# Patient Record
Sex: Female | Born: 1990 | Race: White | Hispanic: No | Marital: Married | Smoking: Former smoker
Health system: Southern US, Community
[De-identification: ages and names within clinical notes are randomized; demographics above are authoritative.]

## PROBLEM LIST (undated history)

## (undated) ENCOUNTER — Inpatient Hospital Stay (HOSPITAL_COMMUNITY): Payer: Self-pay

## (undated) DIAGNOSIS — J45909 Unspecified asthma, uncomplicated: Secondary | ICD-10-CM

## (undated) DIAGNOSIS — Z8632 Personal history of gestational diabetes: Secondary | ICD-10-CM

## (undated) DIAGNOSIS — B009 Herpesviral infection, unspecified: Secondary | ICD-10-CM

## (undated) DIAGNOSIS — O09299 Supervision of pregnancy with other poor reproductive or obstetric history, unspecified trimester: Secondary | ICD-10-CM

## (undated) DIAGNOSIS — O24419 Gestational diabetes mellitus in pregnancy, unspecified control: Secondary | ICD-10-CM

## (undated) HISTORY — DX: Personal history of gestational diabetes: Z86.32

## (undated) HISTORY — DX: Gestational diabetes mellitus in pregnancy, unspecified control: O24.419

## (undated) HISTORY — DX: Supervision of pregnancy with other poor reproductive or obstetric history, unspecified trimester: O09.299

---
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
## 2013-12-02 ENCOUNTER — Encounter (HOSPITAL_COMMUNITY): Payer: Self-pay | Admitting: Emergency Medicine

## 2013-12-02 ENCOUNTER — Emergency Department (HOSPITAL_COMMUNITY)
Admission: EM | Admit: 2013-12-02 | Discharge: 2013-12-02 | Disposition: A | Discharge status: Home / Self Care | Payer: Medicaid Other | Attending: Emergency Medicine | Admitting: Emergency Medicine

## 2013-12-02 DIAGNOSIS — K149 Disease of tongue, unspecified: Secondary | ICD-10-CM

## 2013-12-02 DIAGNOSIS — K1370 Unspecified lesions of oral mucosa: Secondary | ICD-10-CM | POA: Insufficient documentation

## 2013-12-02 DIAGNOSIS — J029 Acute pharyngitis, unspecified: Secondary | ICD-10-CM

## 2013-12-02 DIAGNOSIS — J45909 Unspecified asthma, uncomplicated: Secondary | ICD-10-CM | POA: Insufficient documentation

## 2013-12-02 LAB — RAPID STREP SCREEN (MED CTR MEBANE ONLY): Streptococcus, Group A Screen (Direct): NEGATIVE

## 2013-12-02 MED ORDER — IBUPROFEN 800 MG PO TABS: 800.0000 mg | ORAL_TABLET | Freq: Three times a day (TID) | ORAL | Status: DC

## 2013-12-02 NOTE — Discharge Instructions (Signed)
Call for a follow up appointment with a Family or Primary Care Provider.  Call Dr. Annalee GentaShoemaker for further evaluation of your sore tongue and sore throat.  Return if Symptoms worsen.   Take medication as prescribed.   Emergency Department Resource Guide 1) Find a Doctor and Pay Out of Pocket Although you won't have to find out who is covered by your insurance plan, it is a good idea to ask around and get recommendations. You will then need to call the office and see if the doctor you have chosen will accept you as a new patient and what types of options they offer for patients who are self-pay. Some doctors offer discounts or will set up payment plans for their patients who do not have insurance, but you will need to ask so you aren't surprised when you get to your appointment.  2) Contact Your Local Health Department Not all health departments have doctors that can see patients for sick visits, but many do, so it is worth a call to see if yours does. If you don't know where your local health department is, you can check in your phone book. The CDC also has a tool to help you locate your state's health department, and many state websites also have listings of all of their local health departments.  3) Find a Walk-in Clinic If your illness is not likely to be very severe or complicated, you may want to try a walk in clinic. These are popping up all over the country in pharmacies, drugstores, and shopping centers. They're usually staffed by nurse practitioners or physician assistants that have been trained to treat common illnesses and complaints. They're usually fairly quick and inexpensive. However, if you have serious medical issues or chronic medical problems, these are probably not your best option.  No Primary Care Doctor: - Call Health Connect at  [phone] - they can help you locate a primary care doctor that  accepts your insurance, provides certain services, etc. - Physician Referral Service-  [phone]  Chronic Pain Problems: Organization         Address  Phone   Notes  [facility]  [phone] Patients need to be referred by their primary care doctor.   Medication Assistance: Organization         Address  Phone   Notes  [facility] [address] [place], [place] [address] [phone] --Must be a resident of [facility] -- Must have NO insurance coverage whatsoever (no Medicaid/ Medicare, etc.) -- The pt. MUST have a primary care doctor that directs their care regularly and follows them in the community   MedAssist  [phone]   [employer]  [phone]    Agencies that provide inexpensive medical care: Organization         Address  Phone   Notes  [facility]  [phone]   [facility]    [phone]   [facility] [address] [place], [place] [address] [phone]   [facility] [address], [place] [phone]   Planned Parenthood    [phone]   [facility]    [phone]   [facility]  [address], [place] Phone:  [phone], Fax:  [phone] Hours of Operation:  9 am - 6 pm, M-F.  Also accepts Medicaid/Medicare and self-pay.  [facility] for [place], [address], [place] Phone: [phone], Fax: [phone]. Hours of Operation:  8:30 am - 5:30 pm, M-F.  Also accepts Medicaid and self-pay.  [facility] [place] [address] [place] Phone: [phone]   [place], [place], [place] [phone], Ext. [phone] Mondays & Thursdays: 7-9 AM.  First 15 patients are seen on a first come, first serve basis.    [place] Providers:  Organization         Address  Phone   Notes  [facility] [address],  [place] [phone] Also accepts self-pay patients.  [facility] [id number] [place], [place]  [phone]   [place], [address], [place] [phone]   [facility] [address] [place] [phone]   Lucianne Lei [address], [place]   [phone] Only accepts [place] Access [place] patients after they have their name applied to their card.   Self-Pay (no insurance) in [facility]:  Organization         Address  Phone   Notes  Sickle Cell Patients, [facility] [place] [phone]   [facility] [place] [phone]   [facility] [place]  [place], [place], [place] [phone]   [facility]/Dr. Osei-Bonsu  [address] [place] [address] [place] or [address], [place] [phone] Phone number for both [place] and [place] locations is the same.  Urgent Medical and [facility] [address], [place] [phone]   [facility] [address] [place] or [address] [phone]   [facility] [address], [place] [phone], phone; [phone], fax Sees patients 1st and 3rd Saturday of every month.  Must not qualify for public or private insurance (i.e. Medicaid, Medicare, [place] Health Choice, Veterans' Benefits)  Household income should be no more than 200% of the poverty level The clinic cannot treat you if you are pregnant or think you are pregnant  Sexually transmitted diseases are not treated at the clinic.    Dental Care: Organization         Address  Phone  Notes  [facility] of [facility] [place] [phone] Accepts children up to age 81 who are enrolled in  [place] or [place]; pregnant women with a Medicaid card; and children who have applied for Medicaid or  Health Choice, but were declined, whose parents can pay a reduced fee at time of service.  [facility] of [facility]  [address], [place] [phone] Accepts children up to age 44 who are enrolled in [place] or [place]; pregnant women with a Medicaid card; and children who have applied for Medicaid or  Health Choice, but were declined, whose parents can pay a reduced fee at time of service.  [facility]  [place] [phone] Patients are seen by appointment only. Walk-ins are not accepted. [place] will see patients 52 years of age and older. Monday - Tuesday (8am-5pm) Most Wednesdays (8:30-5pm) $30 per  visit, cash only  [facility] Adult [employer] PROGRAM  [address], [address] [phone] Patients are seen by appointment only. Walk-ins are not accepted. [place] will see patients 48 years of age and older. One Wednesday Evening (Monthly: Volunteer Based).  $30 per visit, cash only  [place]  [phone] for adults; Children under age 62, call Graduate Pediatric Dentistry at [phone]. Children aged 41-14, please call [phone] to request a pediatric application.  Dental services are provided in all areas of dental care including fillings, crowns and bridges, complete and partial dentures, implants, gum treatment, root canals, and extractions. Preventive care is also provided. Treatment is provided to both adults and children. Patients are selected via a lottery and there is often a waiting list.   [facility] [address] [place]  [phone] [email or web address]   [facility] [address] [place], [place] [phone], Ext. [phone] Second and Fourth Thursday of each month, opens at 6:30  AM; Clinic ends at 9 AM.  Patients are seen on a first-come first-served basis, and a limited number are seen during each clinic.   [facility]  [address] Hillard Danker [place], [place] [phone]   Eligibility Requirements You must have lived in [place], [place], or [place] counties for at least the last three months.   You cannot be eligible for state or federal sponsored [employer], including [employer], [place], or [employer].   You generally cannot be eligible for healthcare insurance through your employer.    How to apply: Eligibility screenings are held every Tuesday and Wednesday afternoon from 1:00 pm until 4:00 pm. You do not need an appointment for the interview!  [facility] [address], [place], [place]   [place]  [place] Department  [place]  [phone]    Behavioral Health Resources in the Community: Intensive Outpatient Programs Organization         Address  Phone  Notes  Ecru Dyer. [address] [place], [place] [phone]   [facility] Outpatient [address] [place], [place]   ADS: Alcohol & Drug Svcs [address] [place], [place]   [place] [address]  [place], [place] or [phone]   Substance Abuse Resources Organization         Address  Phone  Notes  Alcohol and Drug Services  [phone]   [place]  [phone]   The [place]   Chinita Pester  [phone]   Residential & Outpatient Substance Abuse Program  [phone]   Psychological Services Organization         Address  Phone  Notes  [facility] [place]  [place]  [phone]   [place] [address] [place] or  [phone]    Mobile Crisis Teams Organization         Address  Phone  Notes  [facility], Mobile Crisis Care Unit  [phone]   [facility]  [address] [place], [place]   [place] [address], [place] [phone]    Self-Help/Support Groups Organization         Address  Phone  Notes  Mental Health Assoc. of [place] - variety of support groups  [place] Call for more information  Narcotics Anonymous (NA), [facility] [address], [employer] [place]  2 meetings at this location   Special educational needs teacher         Address  Phone  Notes  [facility] Residential Treatment [place],    [place]  [phone]   [facility]  [address] [place] 426834, [place], [place]   [place], [place] [phone] Admissions: 8am-3pm M-F  Incentives Substance [place] [address],    [place], [place] [phone]   [facility] [address] [place], [place], [place]   [facility] [address]  [place], [place]   [facility] - Intensive Outpatient [address] Kristeen Mans [address], [place], [place]   [facility] [place].,  [place], [place] [phone] or [phone]   [facility] Services (RTS) [address], [place], [place] Accepts Medicaid  [facility] [place] [address]  [place] [phone] Substance Abuse/Addiction Treatment   [facility] Organization         Address  Phone  Notes  [facility]  [phone]   Domenic Schwab, PhD [address] Arlis Porta [place], [place]   [phone] or [phone]   [place], [place] [phone]   [facility] [address],  [place], [place] [phone] Insurance/Medicaid/sponsorship through [facility] and Families [address], [address]                                    [place], [place] [phone] [place] [address] [place], [place] [phone]    Dr. Adele Schilder  [phone]   [facility] Dept. 1) [address] [place] 2) [place] 3)  [place] 65, [place] [phone]  [phone]   [place] [phone] or [phone] (After Hours)

## 2013-12-02 NOTE — ED Notes (Signed)
Pt presents to department for evaluation of sore throat. Ongoing since Monday. 6/10 pain at the time. Respirations unlabored. Pt is alert and oriented x4.

## 2013-12-02 NOTE — ED Provider Notes (Signed)
CSN: [id number]     Arrival date & time 12/02/13  1229 History  This chart was scribed for non-physician practitioner Mellody DrownLauren Swan Zayed working with Juliet RudeNathan R. Rubin PayorPickering, MD by Carl Bestelina Holson, ED Scribe. This patient was seen in room TR08C/TR08C and the patient's care was started at 12:37 PM.    Chief Complaint  Patient presents with  . Sore Throat   HPI Comments: Lacey Hill is a 23 y.o. female with a history of asthma who presents to the Emergency Department complaining of constant sore throat that started two days ago.  She states that she noticed sores on her tongue before her sore throat started.  The patient states that she has not had mouth sores in the past.  She denies fever, chills, wheezing, and cough as associated symptoms.  She denies eating anything abnormal recently.  She denies any sick contacts.  The patient has a history of tonsillectomy.     Patient is a 23 y.o. female presenting with pharyngitis. The history is provided by the patient. No language interpreter was used.  Sore Throat    No past medical history on file. No past surgical history on file. No family history on file. History  Substance Use Topics  . Smoking status: Not on file  . Smokeless tobacco: Not on file  . Alcohol Use: Not on file   OB History   No data available     Review of Systems  Constitutional: Negative for fever and chills.  HENT: Positive for mouth sores and sore throat. Negative for dental problem, drooling and trouble swallowing.   Respiratory: Negative for cough and wheezing.   All other systems reviewed and are negative.     Allergies  Review of patient's allergies indicates not on file.  Home Medications   Prior to Admission medications   Not on File   Triage Vitals: BP 115/80  Pulse 114  Temp(Src) 98.2 F (36.8 C) (Oral)  Resp 18  SpO2 98%  Physical Exam  Nursing note and vitals reviewed. Constitutional: She is oriented to person, place, and time. She appears  well-developed and well-nourished.  HENT:  Head: Normocephalic and atraumatic.  Mouth/Throat: Uvula is midline and mucous membranes are normal. No trismus in the jaw. No uvula swelling. Posterior oropharyngeal erythema present. No oropharyngeal exudate, posterior oropharyngeal edema or tonsillar abscesses.  Tonsils surgically absent.  Geographic tongue to lateral boarders, tender to palpation.  Eyes: EOM are normal.  Neck: Normal range of motion.  Cardiovascular: Normal rate, regular rhythm and normal heart sounds.  Exam reveals no gallop and no friction rub.   No murmur heard. Pulmonary/Chest: Effort normal.  Musculoskeletal: Normal range of motion.  Lymphadenopathy:       Head (right side): No submental, no submandibular, no tonsillar, no preauricular, no posterior auricular and no occipital adenopathy present.       Head (left side): No submental, no submandibular, no tonsillar, no preauricular, no posterior auricular and no occipital adenopathy present.    She has no cervical adenopathy.    She has no axillary adenopathy.  Neurological: She is alert and oriented to person, place, and time.  Skin: Skin is warm and dry. She is not diaphoretic.  Psychiatric: She has a normal mood and affect. Her behavior is normal.    ED Course  Procedures (including critical care time)  COORDINATION OF CARE: 1:03 PM- Discussed obtaining a rapid strep screen and the patient agreed to the treatment plan.   Labs Review Labs Reviewed  RAPID  STREP SCREEN  CULTURE, GROUP A STREP    Imaging Review No results found.   EKG Interpretation None      MDM   Final diagnoses:  Pharyngitis  Tongue irritation   Rapid strep negative. Patients symptoms are consistent with URI, likely viral etiology.  And questionable viral tongue discomfort. Discussed that antibiotics are not indicated for viral infections. Pt will be discharged with symptomatic treatment.  Verbalizes understanding and is agreeable  with plan. Pt is hemodynamically stable & in NAD prior to dc.  Meds given in ED:  Medications - No data to display  Discharge Medication List as of 12/02/2013  1:48 PM    START taking these medications   Details  ibuprofen (ADVIL,MOTRIN) 800 MG tablet Take 1 tablet (800 mg total) by mouth 3 (three) times daily. Take with food, Starting 12/02/2013, Until Discontinued, Print       I personally performed the services described in this documentation, which was scribed in my presence. The recorded information has been reviewed and is accurate.    Clabe SealLauren M Giah Fickett, PA-C 12/03/13 2249

## 2013-12-02 NOTE — ED Notes (Signed)
C/O her tongue hurting. States that she cannot eat or drink anything. Also C/O having a sore throat but her tongue hurts worse.  States that it began on Monday. Pain is on the sides of her tongue mostly.  RN note some lesions on the side of her tongue.

## 2013-12-04 LAB — CULTURE, GROUP A STREP

## 2013-12-04 NOTE — ED Provider Notes (Signed)
Medical screening examination/treatment/procedure(s) were performed by non-physician practitioner and as supervising physician I was immediately available for consultation/collaboration.   EKG Interpretation None       Telesforo Brosnahan R. Weslee Prestage, MD 12/04/13 0716 

## 2014-10-30 ENCOUNTER — Emergency Department (HOSPITAL_COMMUNITY)
Admission: EM | Admit: 2014-10-30 | Discharge: 2014-10-30 | Disposition: A | Discharge status: Home / Self Care | Payer: Medicaid Other | Attending: Emergency Medicine | Admitting: Emergency Medicine

## 2014-10-30 ENCOUNTER — Emergency Department (HOSPITAL_COMMUNITY): Payer: Medicaid Other

## 2014-10-30 ENCOUNTER — Encounter (HOSPITAL_COMMUNITY): Payer: Self-pay

## 2014-10-30 DIAGNOSIS — J45909 Unspecified asthma, uncomplicated: Secondary | ICD-10-CM | POA: Diagnosis not present

## 2014-10-30 DIAGNOSIS — R079 Chest pain, unspecified: Secondary | ICD-10-CM

## 2014-10-30 DIAGNOSIS — Z7952 Long term (current) use of systemic steroids: Secondary | ICD-10-CM | POA: Insufficient documentation

## 2014-10-30 DIAGNOSIS — Z880 Allergy status to penicillin: Secondary | ICD-10-CM | POA: Insufficient documentation

## 2014-10-30 DIAGNOSIS — Z79899 Other long term (current) drug therapy: Secondary | ICD-10-CM | POA: Diagnosis not present

## 2014-10-30 DIAGNOSIS — R091 Pleurisy: Secondary | ICD-10-CM | POA: Diagnosis not present

## 2014-10-30 DIAGNOSIS — R0789 Other chest pain: Secondary | ICD-10-CM | POA: Insufficient documentation

## 2014-10-30 DIAGNOSIS — R0781 Pleurodynia: Secondary | ICD-10-CM

## 2014-10-30 HISTORY — DX: Unspecified asthma, uncomplicated: J45.909

## 2014-10-30 MED ORDER — NAPROXEN 375 MG PO TABS: ORAL_TABLET | ORAL | Status: DC

## 2014-10-30 MED ORDER — KETOROLAC TROMETHAMINE 60 MG/2ML IM SOLN
60.0000 mg | Freq: Once | INTRAMUSCULAR | Status: AC
Start: 2014-10-30 — End: 2014-10-30
  Administered 2014-10-30: 60 mg via INTRAMUSCULAR
  Filled 2014-10-30: qty 2

## 2014-10-30 NOTE — ED Notes (Signed)
Patient states pain to left upper chest that is worse with deep breathing.

## 2014-10-30 NOTE — ED Notes (Signed)
Patient verbalizes understanding of discharge instructions, prescription medications, home care and follow up care. Patient ambulatory out of department at this time. 

## 2014-10-30 NOTE — ED Provider Notes (Signed)
CSN: [id number]     Arrival date & time 10/30/14  0114 History   First MD Initiated Contact with Patient 10/30/14 0130     Chief Complaint  Patient presents with  . Pleurisy     (Consider location/radiation/quality/duration/timing/severity/associated sxs/prior Treatment) HPI  Patient reports she started getting left lateral chest pain on March 24. She describes the pain as pleuritic and sharp and achy. It has been constant although it is worse when she breathes deep. She also states lifting up her arms and bending over makes the pain worse. She denies wheezing or shortness of breath however she thought maybe it was related to her asthma and did a nebulizer treatment without improvement. She denies cough or fever. She denies any injury. She denies sore throat, rhinorrhea, swelling or pain in her extremities. She denies being on hormones or birth control pills. She states she had similar pain when she was 24 years old and she was diagnosed with pleurisy.   PCP none  Past Medical History  Diagnosis Date  . Asthma    History reviewed. No pertinent past surgical history. No family history on file. History  Substance Use Topics  . Smoking status: Never Smoker   . Smokeless tobacco: Not on file  . Alcohol Use: No   Employed at [employer]   OB History    No data available     Review of Systems  All other systems reviewed and are negative.     Allergies  Penicillins  Home Medications   Prior to Admission medications   Medication Sig Start Date End Date Taking? Authorizing Provider  acyclovir (ZOVIRAX) 200 MG capsule Take 200 mg by mouth 2 (two) times daily.   Yes Historical Provider, MD  albuterol (PROVENTIL HFA;VENTOLIN HFA) 108 (90 BASE) MCG/ACT inhaler Inhale 1 puff into the lungs every 6 (six) hours as needed for wheezing or shortness of breath.   Yes Historical Provider, MD  FLUoxetine (PROZAC) 20 MG tablet Take 20 mg by mouth daily.    Historical Provider, MD    ibuprofen (ADVIL,MOTRIN) 800 MG tablet Take 1 tablet (800 mg total) by mouth 3 (three) times daily. Take with food 12/02/13   Mellody Drown, PA-C  predniSONE (DELTASONE) 20 MG tablet Take 20 mg by mouth daily as needed (for asthma).    Historical Provider, MD   BP 115/77 mmHg  Pulse 70  Temp(Src) 97.5 F (36.4 C) (Oral)  Resp 24  Ht 5' (1.524 m)  Wt 95 lb (43.092 kg)  BMI 18.55 kg/m2  SpO2 100%  LMP 10/05/2014  Vital signs normal   Physical Exam  Constitutional: She is oriented to person, place, and time. She appears well-developed and well-nourished.  Non-toxic appearance. She does not appear ill. No distress.  HENT:  Head: Normocephalic and atraumatic.  Right Ear: External ear normal.  Left Ear: External ear normal.  Nose: Nose normal. No mucosal edema or rhinorrhea.  Mouth/Throat: Oropharynx is clear and moist and mucous membranes are normal. No dental abscesses or uvula swelling.  Eyes: Conjunctivae and EOM are normal. Pupils are equal, round, and reactive to light.  Neck: Normal range of motion and full passive range of motion without pain. Neck supple.  Cardiovascular: Normal rate, regular rhythm and normal heart sounds.  Exam reveals no gallop and no friction rub.   No murmur heard. Pulmonary/Chest: Effort normal and breath sounds normal. No respiratory distress. She has no wheezes. She has no rhonchi. She has no rales. She exhibits tenderness. She  exhibits no crepitus.    Abdominal: Soft. Normal appearance and bowel sounds are normal. She exhibits no distension. There is no tenderness. There is no rebound and no guarding.  Musculoskeletal: Normal range of motion. She exhibits no edema or tenderness.  Moves all extremities well.   Neurological: She is alert and oriented to person, place, and time. She has normal strength. No cranial nerve deficit.  Skin: Skin is warm, dry and intact. No rash noted. No erythema. No pallor.  Psychiatric: She has a normal mood and affect. Her  speech is normal and behavior is normal. Her mood appears not anxious.  Nursing note and vitals reviewed.   ED Course  Procedures (including critical care time)  Medications  ketorolac (TORADOL) injection 60 mg (60 mg Intramuscular Given 10/30/14 0201)   Pt drove herself to the ED. patient was given Toradol IM for her pleuritic chest pain.  Recheck at 3:20 AM patient states her pain is almost gone. She is feeling much improved. We discussed reasons to return to the ED. She was given the results of her chest x-ray.   Labs Review Labs Reviewed - No data to display  Imaging Review Dg Chest 2 View  10/30/2014   CLINICAL DATA:  Left-sided chest pain, worse with deep inspiration. Symptoms for 2 days. History of asthma.  EXAM: CHEST  2 VIEW  COMPARISON:  None.  FINDINGS: The heart size and mediastinal contours are within normal limits. Both lungs are clear. The visualized skeletal structures are unremarkable.  IMPRESSION: No active cardiopulmonary disease.   Electronically Signed   By: Charlett NoseKevin  Dover M.D.   On: 10/30/2014 02:23     EKG Interpretation None      MDM   Final diagnoses:  Pleuritic chest pain  Left sided chest pain   New Prescriptions   NAPROXEN (NAPROSYN) 375 MG TABLET    Take 1 po BID with food prn pain    Plan discharge  Devoria AlbeIva Adenike Shidler, MD, Concha PyoFACEP     Jalien Weakland, MD 10/30/14 [phone]

## 2014-10-30 NOTE — ED Notes (Signed)
Pt reports pain with breathing, especially in left rib area, states she used her nebs and inhaler without relief.

## 2014-10-30 NOTE — Discharge Instructions (Signed)
Take the naproxen for your pleuritic chest pain. Recheck if you get a fever, struggle to breathe, have a worsening cough.  Pleurodynia Pleurodynia is a sharp pain in the muscles between your ribs (intercostal muscles). This condition makes it painful to breathe. Pleurodynia is sometimes described as an iron grip around the rib cage. Pleurodynia attacks are unpredictable. CAUSES  Pleurodynia is commonly caused by a viral infection. A virus, called coxsackievirus B, attacks the intercostal muscles. However, getting pleurodynia from this virus is rare. Most people infected with coxsackievirus B have no symptoms. In some people, the virus causes a mild sore throat, cough, or diarrhea. Coxsackievirus B can live in body fluids, such as saliva and mucus. It is easily spread from person to person through coughing or sneezing. Coming in contact with the stool of an infected person can also spread the virus. SYMPTOMS  Symptoms usually start 3 to 6 days after you have been infected with the virus. Very bad chest pain is the main symptom of pleurodynia. The pain is usually felt on one side of the body, along the lower ribs. It starts suddenly and may last from a few seconds to 1 minute. It is hard to breathe when the pain strikes. You might feel pain again a few minutes or hours later. In most cases, the pain keeps coming back for 3 to 5 days. Then it goes away. In some cases, the pain keeps coming back every so often for up to 1 month. Other symptoms of pleurodynia may include:   Fever.  Rapid heartbeat.  Sore throat.  Cough.  Headache.  Stomach pain.  Nausea.  Vomiting.  Diarrhea.  Feeling very tired.  Rash.  For males, pain in the testicles. DIAGNOSIS  If you have had very bad chest pain, your caregiver will probably order some tests to determine whether you have pleurodynia. These tests may include:  A throat swab. Your caregiver may rub the back of your throat with a cotton swab. The  cotton swab can then be tested for coxsackievirus B.  Urine and stool samples. These samples will be tested for coxsackievirus B.  Blood tests. These tests can tell if you have muscle damage.  Chest X-rays.  Electrocardiography (ECG). This test checks your heartbeat. TREATMENT  There is no treatment for an infection caused by coxsackievirus B. However, pleurodynia usually goes away on its own. It may take up to 1 month to fully recover. You may be given nonsteroidal anti-inflammatory drugs (NSAIDs) to control your pain. If your chest pain continues, you may need to see a pain specialist to discuss possibly using nerve block injections to relieve your pain. HOME CARE INSTRUCTIONS  Only take over-the-counter or prescription medicines for pain, fever, or discomfort as directed by your caregiver.  Return to your regular activities slowly.  Wash your hands often. This helps prevent coxsackievirus B from spreading.  Do not smoke.  Keep all follow-up appointments as directed by your caregiver. SEEK MEDICAL CARE IF:  You have new symptoms.  Your symptoms are getting worse.  You develop a cough.  You have a sore throat.  You have a rash.  You have abdominal pain.  You vomit.  You have diarrhea. SEEK IMMEDIATE MEDICAL CARE IF:  You have very bad chest pain that is getting worse.  You have trouble breathing.  You have a fever. MAKE SURE YOU:  Understand these instructions.  Will watch your condition.  Will get help right away if you are not doing well  or get worse. Document Released: 07/12/2011 Document Revised: 10/15/2011 Document Reviewed: 07/12/2011 [facility] Patient Information 2015 [place], [place]. This information is not intended to replace advice given to you by your health care provider. Make sure you discuss any questions you have with your health care provider.

## 2015-02-04 ENCOUNTER — Encounter: Payer: Medicaid Other | Admitting: Obstetrics & Gynecology

## 2015-08-07 HISTORY — PX: WISDOM TOOTH EXTRACTION: SHX21

## 2016-08-06 NOTE — L&D Delivery Note (Signed)
  Patient is 26 y.o. [id number] [redacted]w[redacted]d admitted w/ SROM in preterm labor, hx of 2 prior pre-term labors, unknown GBS status started on Ancef d/t PCN allergy   Delivery Note At 6:23 AM a viable female was delivered via SVD (Presentation: LOA; vertex ).  APGAR: 8, 9; weight 2065 grams .   Placenta status: delivered spontaneously, in tact.  Cord: 3vc with the following complications: none.  Cord pH: not sent  Anesthesia:  epidural Episiotomy: None Lacerations: None Suture Repair: none Est. Blood Loss (mL): 50  Mom to postpartum.  Baby to Couplet care / Skin to Skin.  Tillman Sers 04/01/2017, 6:32 AM      Upon arrival patient was complete and pushing. She pushed with good maternal effort to deliver a healthy baby boy. Baby delivered without difficulty, was noted to have good tone and place on maternal abdomen for oral suctioning, drying and stimulation. Delayed cord clamping performed. Placenta delivered intact with 3V cord. Vaginal canal and perineum was inspected and in tact; hemostatic. Pitocin was started and uterus massaged until bleeding slowed. Counts of sharps, instruments, and lap pads were all correct.   Tillman Sers, DO PGY-2 8/27/20186:32 AM

## 2016-12-20 ENCOUNTER — Other Ambulatory Visit (HOSPITAL_COMMUNITY)
Admission: RE | Admit: 2016-12-20 | Discharge: 2016-12-20 | Disposition: A | Discharge status: Home / Self Care | Payer: Medicaid Other | Source: Ambulatory Visit | Attending: Certified Nurse Midwife | Admitting: Certified Nurse Midwife

## 2016-12-20 ENCOUNTER — Ambulatory Visit (INDEPENDENT_AMBULATORY_CARE_PROVIDER_SITE_OTHER): Payer: Medicaid Other | Admitting: Certified Nurse Midwife

## 2016-12-20 ENCOUNTER — Encounter: Payer: Self-pay | Admitting: Certified Nurse Midwife

## 2016-12-20 VITALS — BP 94/62 | HR 64 | Wt 120.0 lb

## 2016-12-20 DIAGNOSIS — O26872 Cervical shortening, second trimester: Secondary | ICD-10-CM

## 2016-12-20 DIAGNOSIS — J45909 Unspecified asthma, uncomplicated: Secondary | ICD-10-CM | POA: Insufficient documentation

## 2016-12-20 DIAGNOSIS — O09212 Supervision of pregnancy with history of pre-term labor, second trimester: Secondary | ICD-10-CM

## 2016-12-20 DIAGNOSIS — J4520 Mild intermittent asthma, uncomplicated: Secondary | ICD-10-CM | POA: Diagnosis not present

## 2016-12-20 DIAGNOSIS — Z62810 Personal history of physical and sexual abuse in childhood: Secondary | ICD-10-CM | POA: Insufficient documentation

## 2016-12-20 DIAGNOSIS — O0992 Supervision of high risk pregnancy, unspecified, second trimester: Secondary | ICD-10-CM

## 2016-12-20 DIAGNOSIS — O0932 Supervision of pregnancy with insufficient antenatal care, second trimester: Secondary | ICD-10-CM

## 2016-12-20 DIAGNOSIS — Z3A21 21 weeks gestation of pregnancy: Secondary | ICD-10-CM | POA: Diagnosis not present

## 2016-12-20 DIAGNOSIS — O0990 Supervision of high risk pregnancy, unspecified, unspecified trimester: Secondary | ICD-10-CM | POA: Insufficient documentation

## 2016-12-20 DIAGNOSIS — O09299 Supervision of pregnancy with other poor reproductive or obstetric history, unspecified trimester: Secondary | ICD-10-CM | POA: Insufficient documentation

## 2016-12-20 DIAGNOSIS — O09292 Supervision of pregnancy with other poor reproductive or obstetric history, second trimester: Secondary | ICD-10-CM | POA: Diagnosis not present

## 2016-12-20 DIAGNOSIS — Z8632 Personal history of gestational diabetes: Secondary | ICD-10-CM

## 2016-12-20 DIAGNOSIS — T7422XA Child sexual abuse, confirmed, initial encounter: Secondary | ICD-10-CM | POA: Insufficient documentation

## 2016-12-20 HISTORY — DX: Supervision of pregnancy with other poor reproductive or obstetric history, unspecified trimester: O09.299

## 2016-12-20 MED ORDER — HYDROXYPROGESTERONE CAPROATE 250 MG/ML IM OIL
250.0000 mg | TOPICAL_OIL | INTRAMUSCULAR | Status: DC
  Administered 2016-12-20 – 2017-03-13 (×9): 250 mg via INTRAMUSCULAR

## 2016-12-20 NOTE — Progress Notes (Signed)
Patient is in the office for initial ob visit, reports good fetal movement, denies pain and contractions.
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
## 2016-12-21 LAB — CYTOLOGY - PAP: Diagnosis: NEGATIVE

## 2016-12-21 LAB — CERVICOVAGINAL ANCILLARY ONLY
Bacterial vaginitis: POSITIVE — AB
CHLAMYDIA, DNA PROBE: NEGATIVE
Candida vaginitis: POSITIVE — AB
NEISSERIA GONORRHEA: NEGATIVE
Trichomonas: NEGATIVE

## 2016-12-22 LAB — CULTURE, OB URINE

## 2016-12-22 LAB — URINE CULTURE, OB REFLEX

## 2016-12-24 ENCOUNTER — Other Ambulatory Visit: Payer: Self-pay | Admitting: Certified Nurse Midwife

## 2016-12-24 DIAGNOSIS — B3731 Acute candidiasis of vulva and vagina: Secondary | ICD-10-CM

## 2016-12-24 DIAGNOSIS — N760 Acute vaginitis: Principal | ICD-10-CM

## 2016-12-24 DIAGNOSIS — O0990 Supervision of high risk pregnancy, unspecified, unspecified trimester: Secondary | ICD-10-CM

## 2016-12-24 DIAGNOSIS — B9689 Other specified bacterial agents as the cause of diseases classified elsewhere: Secondary | ICD-10-CM

## 2016-12-24 DIAGNOSIS — B373 Candidiasis of vulva and vagina: Secondary | ICD-10-CM

## 2016-12-24 MED ORDER — FLUCONAZOLE 150 MG PO TABS: 150.0000 mg | ORAL_TABLET | Freq: Once | ORAL | 0 refills | Status: AC

## 2016-12-24 MED ORDER — TERCONAZOLE 0.8 % VA CREA: 1.0000 | TOPICAL_CREAM | Freq: Every day | VAGINAL | 0 refills | Status: DC

## 2016-12-24 MED ORDER — METRONIDAZOLE 500 MG PO TABS: 500.0000 mg | ORAL_TABLET | Freq: Two times a day (BID) | ORAL | 0 refills | Status: DC

## 2016-12-25 ENCOUNTER — Other Ambulatory Visit: Payer: Self-pay | Admitting: Certified Nurse Midwife

## 2016-12-25 DIAGNOSIS — O0990 Supervision of high risk pregnancy, unspecified, unspecified trimester: Secondary | ICD-10-CM

## 2016-12-25 DIAGNOSIS — O361920 Maternal care for other isoimmunization, second trimester, not applicable or unspecified: Secondary | ICD-10-CM | POA: Insufficient documentation

## 2016-12-25 LAB — AFP, SERUM, OPEN SPINA BIFIDA
AFP MOM: 1.67
AFP Value: 127.4 ng/mL
Gest. Age on Collection Date: 21.2 weeks
MATERNAL AGE AT EDD: 29 a
OSBR RISK 1 IN: 1757
Test Results:: NEGATIVE
WEIGHT: 120 [lb_av]

## 2016-12-25 LAB — OBSTETRIC PANEL, INCLUDING HIV
Basophils Absolute: 0 10*3/uL (ref 0.0–0.2)
Basos: 0 %
EOS (ABSOLUTE): 0.1 10*3/uL (ref 0.0–0.4)
Eos: 1 %
HEMOGLOBIN: 12.8 g/dL (ref 11.1–15.9)
HIV Screen 4th Generation wRfx: NONREACTIVE
Hematocrit: 37.2 % (ref 34.0–46.6)
Hepatitis B Surface Ag: NEGATIVE
IMMATURE GRANS (ABS): 0 10*3/uL (ref 0.0–0.1)
Immature Granulocytes: 0 %
LYMPHS ABS: 2 10*3/uL (ref 0.7–3.1)
Lymphs: 18 %
MCH: 32.4 pg (ref 26.6–33.0)
MCHC: 34.4 g/dL (ref 31.5–35.7)
MCV: 94 fL (ref 79–97)
MONOS ABS: 0.5 10*3/uL (ref 0.1–0.9)
Monocytes: 4 %
NEUTROS PCT: 77 %
Neutrophils Absolute: 8.8 10*3/uL — ABNORMAL HIGH (ref 1.4–7.0)
Platelets: 262 10*3/uL (ref 150–379)
RBC: 3.95 x10E6/uL (ref 3.77–5.28)
RDW: 14 % (ref 12.3–15.4)
RPR Ser Ql: NONREACTIVE
Rh Factor: POSITIVE
Rubella Antibodies, IGG: 1.05 index (ref >0.99)
WBC: 11.4 10*3/uL — AB (ref 3.4–10.8)

## 2016-12-25 LAB — HEMOGLOBINOPATHY EVALUATION
HEMOGLOBIN A2 QUANTITATION: 3 % (ref 1.8–3.2)
HEMOGLOBIN F QUANTITATION: 0 % (ref 0.0–2.0)
HGB C: 0 %
HGB S: 0 %
HGB VARIANT: 0 %
Hgb A: 97 % (ref 96.4–98.8)

## 2016-12-25 LAB — AB SCR+ANTIBODY ID: ANTIBODY SCREEN: POSITIVE — AB

## 2016-12-25 LAB — HEMOGLOBIN A1C
Est. average glucose Bld gHb Est-mCnc: 80 mg/dL
Hgb A1c MFr Bld: 4.4 % — ABNORMAL LOW (ref 4.8–5.6)

## 2016-12-25 LAB — VARICELLA ZOSTER ANTIBODY, IGG: VARICELLA: 457 {index} (ref >165)

## 2016-12-26 ENCOUNTER — Other Ambulatory Visit: Payer: Self-pay | Admitting: Certified Nurse Midwife

## 2016-12-26 ENCOUNTER — Ambulatory Visit (HOSPITAL_COMMUNITY)
Admission: RE | Admit: 2016-12-26 | Discharge: 2016-12-26 | Disposition: A | Discharge status: Home / Self Care | Payer: Medicaid Other | Source: Ambulatory Visit | Attending: Certified Nurse Midwife | Admitting: Certified Nurse Midwife

## 2016-12-26 DIAGNOSIS — Z3A22 22 weeks gestation of pregnancy: Secondary | ICD-10-CM

## 2016-12-26 DIAGNOSIS — Z363 Encounter for antenatal screening for malformations: Secondary | ICD-10-CM | POA: Diagnosis present

## 2016-12-26 DIAGNOSIS — O09292 Supervision of pregnancy with other poor reproductive or obstetric history, second trimester: Secondary | ICD-10-CM | POA: Insufficient documentation

## 2016-12-26 DIAGNOSIS — Z369 Encounter for antenatal screening, unspecified: Secondary | ICD-10-CM

## 2016-12-26 DIAGNOSIS — O0990 Supervision of high risk pregnancy, unspecified, unspecified trimester: Secondary | ICD-10-CM

## 2016-12-26 DIAGNOSIS — Z8632 Personal history of gestational diabetes: Secondary | ICD-10-CM | POA: Diagnosis not present

## 2016-12-26 DIAGNOSIS — O09299 Supervision of pregnancy with other poor reproductive or obstetric history, unspecified trimester: Secondary | ICD-10-CM

## 2016-12-26 DIAGNOSIS — O09212 Supervision of pregnancy with history of pre-term labor, second trimester: Secondary | ICD-10-CM

## 2016-12-26 DIAGNOSIS — O09892 Supervision of other high risk pregnancies, second trimester: Secondary | ICD-10-CM | POA: Insufficient documentation

## 2016-12-26 DIAGNOSIS — O26872 Cervical shortening, second trimester: Secondary | ICD-10-CM

## 2016-12-27 ENCOUNTER — Ambulatory Visit (INDEPENDENT_AMBULATORY_CARE_PROVIDER_SITE_OTHER): Payer: Medicaid Other

## 2016-12-27 VITALS — BP 108/64 | HR 74 | Wt 120.6 lb

## 2016-12-27 DIAGNOSIS — O09292 Supervision of pregnancy with other poor reproductive or obstetric history, second trimester: Secondary | ICD-10-CM

## 2016-12-27 DIAGNOSIS — O09212 Supervision of pregnancy with history of pre-term labor, second trimester: Secondary | ICD-10-CM

## 2016-12-27 NOTE — Progress Notes (Signed)
Patient presents for 17P Injection. Given in Left Deltoid. Tolerated well. Administrations This Visit    hydroxyprogesterone caproate (MAKENA) 250 mg/mL injection 250 mg    Admin Date 12/27/2016 Action Given Dose 250 mg Route Intramuscular Administered By Maretta BeesMcGlashan, Emmajo Bennette J, RMA

## 2016-12-28 ENCOUNTER — Other Ambulatory Visit: Payer: Self-pay | Admitting: Certified Nurse Midwife

## 2016-12-28 DIAGNOSIS — O0990 Supervision of high risk pregnancy, unspecified, unspecified trimester: Secondary | ICD-10-CM

## 2016-12-28 DIAGNOSIS — O0932 Supervision of pregnancy with insufficient antenatal care, second trimester: Secondary | ICD-10-CM

## 2017-01-03 ENCOUNTER — Ambulatory Visit (INDEPENDENT_AMBULATORY_CARE_PROVIDER_SITE_OTHER): Payer: Medicaid Other

## 2017-01-03 DIAGNOSIS — O09292 Supervision of pregnancy with other poor reproductive or obstetric history, second trimester: Secondary | ICD-10-CM

## 2017-01-03 MED ORDER — HYDROXYPROGESTERONE CAPROATE 250 MG/ML IM OIL
250.0000 mg | TOPICAL_OIL | Freq: Once | INTRAMUSCULAR | Status: AC
  Administered 2017-01-03: 250 mg via INTRAMUSCULAR

## 2017-01-03 NOTE — Progress Notes (Addendum)
Pt supplied 17p injection given w/o difficulty. Pt prefers R Del. During visit pt c/o abdominal pressure, reports +FM, denies LOF, denies VB, denies pain, and ctx's. Pt advised to take Tylenol as directed for the pressure and  drink plenty of water. If sx's worsens, contact the office during office hrs or MAU after hrs. Pt agrees and has no further questions.

## 2017-01-04 LAB — INHERITEST SOCIETY GUIDED

## 2017-01-07 ENCOUNTER — Other Ambulatory Visit: Payer: Self-pay | Admitting: Certified Nurse Midwife

## 2017-01-07 DIAGNOSIS — O0990 Supervision of high risk pregnancy, unspecified, unspecified trimester: Secondary | ICD-10-CM

## 2017-01-10 ENCOUNTER — Ambulatory Visit (INDEPENDENT_AMBULATORY_CARE_PROVIDER_SITE_OTHER): Payer: Medicaid Other

## 2017-01-10 VITALS — BP 110/68 | HR 90 | Wt 128.8 lb

## 2017-01-10 DIAGNOSIS — O09292 Supervision of pregnancy with other poor reproductive or obstetric history, second trimester: Secondary | ICD-10-CM | POA: Diagnosis not present

## 2017-01-10 MED ORDER — HYDROXYPROGESTERONE CAPROATE 250 MG/ML IM OIL: 250.0000 mg | TOPICAL_OIL | Freq: Once | INTRAMUSCULAR | Status: DC

## 2017-01-17 ENCOUNTER — Ambulatory Visit (INDEPENDENT_AMBULATORY_CARE_PROVIDER_SITE_OTHER): Payer: Medicaid Other | Admitting: Obstetrics and Gynecology

## 2017-01-17 VITALS — BP 105/69 | HR 73 | Wt 121.0 lb

## 2017-01-17 DIAGNOSIS — O09292 Supervision of pregnancy with other poor reproductive or obstetric history, second trimester: Secondary | ICD-10-CM | POA: Diagnosis not present

## 2017-01-17 DIAGNOSIS — O0990 Supervision of high risk pregnancy, unspecified, unspecified trimester: Secondary | ICD-10-CM

## 2017-01-17 DIAGNOSIS — O0992 Supervision of high risk pregnancy, unspecified, second trimester: Secondary | ICD-10-CM

## 2017-01-17 DIAGNOSIS — O09299 Supervision of pregnancy with other poor reproductive or obstetric history, unspecified trimester: Secondary | ICD-10-CM

## 2017-01-17 DIAGNOSIS — O0932 Supervision of pregnancy with insufficient antenatal care, second trimester: Secondary | ICD-10-CM

## 2017-01-17 DIAGNOSIS — O09212 Supervision of pregnancy with history of pre-term labor, second trimester: Secondary | ICD-10-CM

## 2017-01-17 DIAGNOSIS — Z8632 Personal history of gestational diabetes: Secondary | ICD-10-CM

## 2017-01-17 MED ORDER — FAMOTIDINE 20 MG PO TABS: 20.0000 mg | ORAL_TABLET | Freq: Two times a day (BID) | ORAL | 3 refills | Status: AC

## 2017-01-17 MED ORDER — ALBUTEROL SULFATE HFA 108 (90 BASE) MCG/ACT IN AERS: 1.0000 | INHALATION_SPRAY | Freq: Four times a day (QID) | RESPIRATORY_TRACT | 6 refills | Status: AC | PRN

## 2017-01-17 MED ORDER — HYDROXYPROGESTERONE CAPROATE 250 MG/ML IM OIL
250.0000 mg | TOPICAL_OIL | Freq: Once | INTRAMUSCULAR | Status: AC
  Administered 2017-01-17: 250 mg via INTRAMUSCULAR
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
## 2017-01-17 NOTE — Progress Notes (Signed)
ROB c/o heartburn, no relief with Tums and Coughing, no relief with Robitussin. Pt supplied Makena given L Del w/o complaint.

## 2017-01-24 ENCOUNTER — Ambulatory Visit (INDEPENDENT_AMBULATORY_CARE_PROVIDER_SITE_OTHER): Payer: Medicaid Other

## 2017-01-24 DIAGNOSIS — O0990 Supervision of high risk pregnancy, unspecified, unspecified trimester: Secondary | ICD-10-CM

## 2017-01-24 DIAGNOSIS — O09212 Supervision of pregnancy with history of pre-term labor, second trimester: Secondary | ICD-10-CM

## 2017-01-24 MED ORDER — HYDROXYPROGESTERONE CAPROATE 250 MG/ML IM OIL
250.0000 mg | TOPICAL_OIL | Freq: Once | INTRAMUSCULAR | Status: AC
  Administered 2017-01-24: 250 mg via INTRAMUSCULAR

## 2017-01-24 NOTE — Progress Notes (Signed)
Pt here for makena injection. Injection given in left deltoid. Pt tolerated well.

## 2017-01-28 ENCOUNTER — Encounter: Payer: Self-pay | Admitting: *Deleted

## 2017-01-31 ENCOUNTER — Other Ambulatory Visit: Payer: Medicaid Other

## 2017-01-31 ENCOUNTER — Ambulatory Visit (HOSPITAL_COMMUNITY)
Admission: RE | Admit: 2017-01-31 | Discharge: 2017-01-31 | Disposition: A | Discharge status: Home / Self Care | Payer: Medicaid Other | Source: Ambulatory Visit | Attending: Obstetrics and Gynecology | Admitting: Obstetrics and Gynecology

## 2017-01-31 ENCOUNTER — Other Ambulatory Visit: Payer: Self-pay | Admitting: Obstetrics and Gynecology

## 2017-01-31 ENCOUNTER — Ambulatory Visit (HOSPITAL_COMMUNITY)
Admission: RE | Admit: 2017-01-31 | Discharge: 2017-01-31 | Disposition: A | Discharge status: Home / Self Care | Payer: Medicaid Other | Source: Ambulatory Visit | Attending: Certified Nurse Midwife | Admitting: Certified Nurse Midwife

## 2017-01-31 ENCOUNTER — Encounter (HOSPITAL_COMMUNITY): Payer: Self-pay

## 2017-01-31 ENCOUNTER — Ambulatory Visit (INDEPENDENT_AMBULATORY_CARE_PROVIDER_SITE_OTHER): Payer: Medicaid Other | Admitting: Obstetrics and Gynecology

## 2017-01-31 VITALS — BP 116/64 | HR 106 | Wt 126.5 lb

## 2017-01-31 VITALS — BP 103/65 | HR 96 | Wt 126.9 lb

## 2017-01-31 DIAGNOSIS — O0992 Supervision of high risk pregnancy, unspecified, second trimester: Secondary | ICD-10-CM

## 2017-01-31 DIAGNOSIS — Z8632 Personal history of gestational diabetes: Secondary | ICD-10-CM

## 2017-01-31 DIAGNOSIS — Z8751 Personal history of pre-term labor: Secondary | ICD-10-CM

## 2017-01-31 DIAGNOSIS — O0990 Supervision of high risk pregnancy, unspecified, unspecified trimester: Secondary | ICD-10-CM

## 2017-01-31 DIAGNOSIS — O09292 Supervision of pregnancy with other poor reproductive or obstetric history, second trimester: Secondary | ICD-10-CM

## 2017-01-31 DIAGNOSIS — O361920 Maternal care for other isoimmunization, second trimester, not applicable or unspecified: Secondary | ICD-10-CM | POA: Diagnosis not present

## 2017-01-31 DIAGNOSIS — O09293 Supervision of pregnancy with other poor reproductive or obstetric history, third trimester: Secondary | ICD-10-CM | POA: Diagnosis not present

## 2017-01-31 DIAGNOSIS — Z3A27 27 weeks gestation of pregnancy: Secondary | ICD-10-CM

## 2017-01-31 DIAGNOSIS — O09212 Supervision of pregnancy with history of pre-term labor, second trimester: Secondary | ICD-10-CM | POA: Diagnosis not present

## 2017-01-31 DIAGNOSIS — O09299 Supervision of pregnancy with other poor reproductive or obstetric history, unspecified trimester: Secondary | ICD-10-CM

## 2017-01-31 DIAGNOSIS — O0932 Supervision of pregnancy with insufficient antenatal care, second trimester: Secondary | ICD-10-CM

## 2017-01-31 HISTORY — DX: Herpesviral infection, unspecified: B00.9

## 2017-01-31 MED ORDER — HYDROXYPROGESTERONE CAPROATE 250 MG/ML IM OIL: 250.0000 mg | TOPICAL_OIL | Freq: Once | INTRAMUSCULAR | Status: DC
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
## 2017-01-31 NOTE — Progress Notes (Signed)
Lacey Hill is a 26 year old, [id number], currently at 7327 weeks 2 days gestation with an EDD of 04/30/2017 seen today for history of preterm delivery and recommendations. She has had bleeding through the early part of pregnancy until ~ 22 weeks. She did a pregnancy test ~ 20 weeks secondary to bleeding going from ~7 days to 3 days and presented for care. Was started on 17-OHP for history of delivery at 35 (PROM/PTD) and 25 weeks (PTL/PTD). She has had no other issues this pregnancy and denies current bleeding, cramping, abdominal pain, ROM etc.   Past Medical History:  Diagnosis Date  . Asthma   . Gestational diabetes   . Herpes    Past Surgical History:  Procedure Laterality Date  . TONSILLECTOMY  1995  . WISDOM TOOTH EXTRACTION  2017   Obstetric History   G4   P3   T1   P2   A0   L3    SAB0   TAB0   Ectopic0   Multiple0   Live Births3     # Outcome Date GA Lbr Len/2nd Weight Sex Delivery Anes PTL Lv  4 Current           3 Preterm 10/08/12 4245w0d   F Vag-Spont   LIV  2 Preterm 07/20/11 1670w0d   M Vag-Spont   LIV  1 Term 04/09/09 722w0d   M Vag-Spont   LIV     Social History   Social History  . Marital status: Single    Spouse name: N/A  . Number of children: N/A  . Years of education: N/A   Occupational History  . Not on file.   Social History Main Topics  . Smoking status: Former Smoker    Types: Cigarettes    Quit date: 2016  . Smokeless tobacco: Never Used  . Alcohol use No  . Drug use: No  . Sexual activity: Not Currently    Birth control/ protection: None   Other Topics Concern  . Not on file   Social History Narrative  . No narrative on file   Family History  Problem Relation Age of Onset  . Heart attack Mother   . Diabetes Mother   . Hypertension Mother   . Fibromyalgia Mother   . Cervical cancer Mother   . Colon cancer Paternal Grandfather    Allergies  Allergen Reactions  . Penicillins     Impression/Recommendations #1 History of preterm  delivery - History of delivery at 35 weeks with PROM/ and rapid subsequent PTD.  - History of delivery at 25 weeks with no symptoms other than back pain, noted to be dilated in clinic and delivered soon after - She was started on 17-OHP at NOB visit and is doing fine with this therapy - Cervical length today ~3.36 cm without funneling (she does not need more of these - cervical exam for symptoms) - Continue current therapy and plan #2 Atypical antibody noted on screen - Plan in EMR lab section was to repeat the sample and she had blood drawn for 28 week labs today - Based on atypical antibody presence, type and titer, she may need further evaluation and testing.  #3 Maternal herpes listed in medical history (noted after seeing patient - confirm presence and type) - Consider starting prophylaxis sooner given history of preterm delivery if indicated  Questions appear answered to her satisfaction. Precautions for the above given. Spent greater than 1/2 of 35 minutes face to face counseling

## 2017-01-31 NOTE — Progress Notes (Signed)
Administrations This Visit    hydroxyprogesterone caproate (MAKENA) 250 mg/mL injection 250 mg    Admin Date 01/31/2017 Action Given Dose 250 mg Route Intramuscular Administered By Maretta BeesMcGlashan, Elias Bordner J, RMA         TDAP will be given at next visit, we have none in stock.

## 2017-02-01 LAB — GLUCOSE TOLERANCE, 2 HOURS W/ 1HR
GLUCOSE, 1 HOUR: 116 mg/dL (ref 65–179)
Glucose, 2 hour: 121 mg/dL (ref 65–152)
Glucose, Fasting: 68 mg/dL (ref 65–91)

## 2017-02-01 LAB — RPR: RPR: NONREACTIVE

## 2017-02-01 LAB — CBC
HEMOGLOBIN: 11.8 g/dL (ref 11.1–15.9)
Hematocrit: 35.6 % (ref 34.0–46.6)
MCH: 31.7 pg (ref 26.6–33.0)
MCHC: 33.1 g/dL (ref 31.5–35.7)
MCV: 96 fL (ref 79–97)
PLATELETS: 202 10*3/uL (ref 150–379)
RBC: 3.72 x10E6/uL — AB (ref 3.77–5.28)
RDW: 13.3 % (ref 12.3–15.4)
WBC: 10.6 10*3/uL (ref 3.4–10.8)

## 2017-02-01 LAB — HIV ANTIBODY (ROUTINE TESTING W REFLEX): HIV SCREEN 4TH GENERATION: NONREACTIVE

## 2017-02-07 ENCOUNTER — Ambulatory Visit: Payer: Medicaid Other

## 2017-02-07 ENCOUNTER — Ambulatory Visit (INDEPENDENT_AMBULATORY_CARE_PROVIDER_SITE_OTHER): Payer: Medicaid Other | Admitting: Obstetrics & Gynecology

## 2017-02-07 ENCOUNTER — Encounter: Payer: Self-pay | Admitting: Obstetrics & Gynecology

## 2017-02-07 DIAGNOSIS — Z23 Encounter for immunization: Secondary | ICD-10-CM

## 2017-02-07 DIAGNOSIS — O09293 Supervision of pregnancy with other poor reproductive or obstetric history, third trimester: Secondary | ICD-10-CM

## 2017-02-07 DIAGNOSIS — O0993 Supervision of high risk pregnancy, unspecified, third trimester: Secondary | ICD-10-CM | POA: Diagnosis not present

## 2017-02-07 DIAGNOSIS — O0990 Supervision of high risk pregnancy, unspecified, unspecified trimester: Secondary | ICD-10-CM

## 2017-02-07 DIAGNOSIS — O09213 Supervision of pregnancy with history of pre-term labor, third trimester: Secondary | ICD-10-CM | POA: Diagnosis not present

## 2017-02-07 NOTE — Progress Notes (Signed)
ROB/17P.   17P given in left deltoid. Tolerated well.  Administrations This Visit    hydroxyprogesterone caproate (MAKENA) 250 mg/mL injection 250 mg    Admin Date 02/07/2017 Action Given Dose 250 mg Route Intramuscular Administered By Maretta BeesMcGlashan, Sumayyah Custodio J, RMA

## 2017-02-07 NOTE — Patient Instructions (Signed)
Third Trimester of Pregnancy The third trimester is from week 28 through week 40 (months 7 through 9). The third trimester is a time when the unborn baby (fetus) is growing rapidly. At the end of the ninth month, the fetus is about 20 inches in length and weighs 6-10 pounds. Body changes during your third trimester Your body will continue to go through many changes during pregnancy. The changes vary from woman to woman. During the third trimester:  Your weight will continue to increase. You can expect to gain 25-35 pounds (11-16 kg) by the end of the pregnancy.  You may begin to get stretch marks on your hips, abdomen, and breasts.  You may urinate more often because the fetus is moving lower into your pelvis and pressing on your bladder.  You may develop or continue to have heartburn. This is caused by increased hormones that slow down muscles in the digestive tract.  You may develop or continue to have constipation because increased hormones slow digestion and cause the muscles that push waste through your intestines to relax.  You may develop hemorrhoids. These are swollen veins (varicose veins) in the rectum that can itch or be painful.  You may develop swollen, bulging veins (varicose veins) in your legs.  You may have increased body aches in the pelvis, back, or thighs. This is due to weight gain and increased hormones that are relaxing your joints.  You may have changes in your hair. These can include thickening of your hair, rapid growth, and changes in texture. Some women also have hair loss during or after pregnancy, or hair that feels dry or thin. Your hair will most likely return to normal after your baby is born.  Your breasts will continue to grow and they will continue to become tender. A yellow fluid (colostrum) may leak from your breasts. This is the first milk you are producing for your baby.  Your belly button may stick out.  You may notice more swelling in your hands,  face, or ankles.  You may have increased tingling or numbness in your hands, arms, and legs. The skin on your belly may also feel numb.  You may feel short of breath because of your expanding uterus.  You may have more problems sleeping. This can be caused by the size of your belly, increased need to urinate, and an increase in your body's metabolism.  You may notice the fetus "dropping," or moving lower in your abdomen (lightening).  You may have increased vaginal discharge.  You may notice your joints feel loose and you may have pain around your pelvic bone.  What to expect at prenatal visits You will have prenatal exams every 2 weeks until week 36. Then you will have weekly prenatal exams. During a routine prenatal visit:  You will be weighed to make sure you and the baby are growing normally.  Your blood pressure will be taken.  Your abdomen will be measured to track your baby's growth.  The fetal heartbeat will be listened to.  Any test results from the previous visit will be discussed.  You may have a cervical check near your due date to see if your cervix has softened or thinned (effaced).  You will be tested for Group B streptococcus. This happens between 35 and 37 weeks.  Your health care provider may ask you:  What your birth plan is.  How you are feeling.  If you are feeling the baby move.  If you have had   any abnormal symptoms, such as leaking fluid, bleeding, severe headaches, or abdominal cramping.  If you are using any tobacco products, including cigarettes, chewing tobacco, and electronic cigarettes.  If you have any questions.  Other tests or screenings that may be performed during your third trimester include:  Blood tests that check for low iron levels (anemia).  Fetal testing to check the health, activity level, and growth of the fetus. Testing is done if you have certain medical conditions or if there are problems during the  pregnancy.  Nonstress test (NST). This test checks the health of your baby to make sure there are no signs of problems, such as the baby not getting enough oxygen. During this test, a belt is placed around your belly. The baby is made to move, and its heart rate is monitored during movement.  What is false labor? False labor is a condition in which you feel small, irregular tightenings of the muscles in the womb (contractions) that usually go away with rest, changing position, or drinking water. These are called Braxton Hicks contractions. Contractions may last for hours, days, or even weeks before true labor sets in. If contractions come at regular intervals, become more frequent, increase in intensity, or become painful, you should see your health care provider. What are the signs of labor?  Abdominal cramps.  Regular contractions that start at 10 minutes apart and become stronger and more frequent with time.  Contractions that start on the top of the uterus and spread down to the lower abdomen and back.  Increased pelvic pressure and dull back pain.  A watery or bloody mucus discharge that comes from the vagina.  Leaking of amniotic fluid. This is also known as your "water breaking." It could be a slow trickle or a gush. Let your health care provider know if it has a color or strange odor. If you have any of these signs, call your health care provider right away, even if it is before your due date. Follow these instructions at home: Medicines  Follow your health care provider's instructions regarding medicine use. Specific medicines may be either safe or unsafe to take during pregnancy.  Take a prenatal vitamin that contains at least 600 micrograms (mcg) of folic acid.  If you develop constipation, try taking a stool softener if your health care provider approves. Eating and drinking  Eat a balanced diet that includes fresh fruits and vegetables, whole grains, good sources of protein  such as meat, eggs, or tofu, and low-fat dairy. Your health care provider will help you determine the amount of weight gain that is right for you.  Avoid raw meat and uncooked cheese. These carry germs that can cause birth defects in the baby.  If you have low calcium intake from food, talk to your health care provider about whether you should take a daily calcium supplement.  Eat four or five small meals rather than three large meals a day.  Limit foods that are high in fat and processed sugars, such as fried and sweet foods.  To prevent constipation: ? Drink enough fluid to keep your urine clear or pale yellow. ? Eat foods that are high in fiber, such as fresh fruits and vegetables, whole grains, and beans. Activity  Exercise only as directed by your health care provider. Most women can continue their usual exercise routine during pregnancy. Try to exercise for 30 minutes at least 5 days a week. Stop exercising if you experience uterine contractions.  Avoid heavy   lifting.  Do not exercise in extreme heat or humidity, or at high altitudes.  Wear low-heel, comfortable shoes.  Practice good posture.  You may continue to have sex unless your health care provider tells you otherwise. Relieving pain and discomfort  Take frequent breaks and rest with your legs elevated if you have leg cramps or low back pain.  Take warm sitz baths to soothe any pain or discomfort caused by hemorrhoids. Use hemorrhoid cream if your health care provider approves.  Wear a good support bra to prevent discomfort from breast tenderness.  If you develop varicose veins: ? Wear support pantyhose or compression stockings as told by your healthcare provider. ? Elevate your feet for 15 minutes, 3-4 times a day. Prenatal care  Write down your questions. Take them to your prenatal visits.  Keep all your prenatal visits as told by your health care provider. This is important. Safety  Wear your seat belt at  all times when driving.  Make a list of emergency phone numbers, including numbers for family, friends, the hospital, and police and fire departments. General instructions  Avoid cat litter boxes and soil used by cats. These carry germs that can cause birth defects in the baby. If you have a cat, ask someone to clean the litter box for you.  Do not travel far distances unless it is absolutely necessary and only with the approval of your health care provider.  Do not use hot tubs, steam rooms, or saunas.  Do not drink alcohol.  Do not use any products that contain nicotine or tobacco, such as cigarettes and e-cigarettes. If you need help quitting, ask your health care provider.  Do not use any medicinal herbs or unprescribed drugs. These chemicals affect the formation and growth of the baby.  Do not douche or use tampons or scented sanitary pads.  Do not cross your legs for long periods of time.  To prepare for the arrival of your baby: ? Take prenatal classes to understand, practice, and ask questions about labor and delivery. ? Make a trial run to the hospital. ? Visit the hospital and tour the maternity area. ? Arrange for maternity or paternity leave through employers. ? Arrange for family and friends to take care of pets while you are in the hospital. ? Purchase a rear-facing car seat and make sure you know how to install it in your car. ? Pack your hospital bag. ? Prepare the baby's nursery. Make sure to remove all pillows and stuffed animals from the baby's crib to prevent suffocation.  Visit your dentist if you have not gone during your pregnancy. Use a soft toothbrush to brush your teeth and be gentle when you floss. Contact a health care provider if:  You are unsure if you are in labor or if your water has broken.  You become dizzy.  You have mild pelvic cramps, pelvic pressure, or nagging pain in your abdominal area.  You have lower back pain.  You have persistent  nausea, vomiting, or diarrhea.  You have an unusual or bad smelling vaginal discharge.  You have pain when you urinate. Get help right away if:  Your water breaks before 37 weeks.  You have regular contractions less than 5 minutes apart before 37 weeks.  You have a fever.  You are leaking fluid from your vagina.  You have spotting or bleeding from your vagina.  You have severe abdominal pain or cramping.  You have rapid weight loss or weight gain.    You have shortness of breath with chest pain.  You notice sudden or extreme swelling of your face, hands, ankles, feet, or legs.  Your baby makes fewer than 10 movements in 2 hours.  You have severe headaches that do not go away when you take medicine.  You have vision changes. Summary  The third trimester is from week 28 through week 40, months 7 through 9. The third trimester is a time when the unborn baby (fetus) is growing rapidly.  During the third trimester, your discomfort may increase as you and your baby continue to gain weight. You may have abdominal, leg, and back pain, sleeping problems, and an increased need to urinate.  During the third trimester your breasts will keep growing and they will continue to become tender. A yellow fluid (colostrum) may leak from your breasts. This is the first milk you are producing for your baby.  False labor is a condition in which you feel small, irregular tightenings of the muscles in the womb (contractions) that eventually go away. These are called Braxton Hicks contractions. Contractions may last for hours, days, or even weeks before true labor sets in.  Signs of labor can include: abdominal cramps; regular contractions that start at 10 minutes apart and become stronger and more frequent with time; watery or bloody mucus discharge that comes from the vagina; increased pelvic pressure and dull back pain; and leaking of amniotic fluid. This information is not intended to replace advice  given to you by your health care provider. Make sure you discuss any questions you have with your health care provider. Document Released: 07/17/2001 Document Revised: 12/29/2015 Document Reviewed: 09/23/2012 Elsevier Interactive Patient Education  2017 [employer]  
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
## 2017-02-14 ENCOUNTER — Ambulatory Visit (INDEPENDENT_AMBULATORY_CARE_PROVIDER_SITE_OTHER): Payer: Medicaid Other | Admitting: *Deleted

## 2017-02-14 ENCOUNTER — Ambulatory Visit: Payer: Medicaid Other

## 2017-02-14 ENCOUNTER — Encounter (HOSPITAL_COMMUNITY): Payer: Self-pay | Admitting: *Deleted

## 2017-02-14 ENCOUNTER — Encounter: Payer: Medicaid Other | Admitting: Obstetrics & Gynecology

## 2017-02-14 ENCOUNTER — Inpatient Hospital Stay (HOSPITAL_COMMUNITY)
Admission: AD | Admit: 2017-02-14 | Discharge: 2017-02-14 | Disposition: A | Discharge status: Home / Self Care | Payer: Medicaid Other | Source: Ambulatory Visit | Attending: Obstetrics & Gynecology | Admitting: Obstetrics & Gynecology

## 2017-02-14 VITALS — BP 111/73 | HR 78 | Wt 126.6 lb

## 2017-02-14 DIAGNOSIS — O09213 Supervision of pregnancy with history of pre-term labor, third trimester: Secondary | ICD-10-CM

## 2017-02-14 DIAGNOSIS — O26893 Other specified pregnancy related conditions, third trimester: Secondary | ICD-10-CM | POA: Diagnosis not present

## 2017-02-14 DIAGNOSIS — R252 Cramp and spasm: Secondary | ICD-10-CM

## 2017-02-14 DIAGNOSIS — O09293 Supervision of pregnancy with other poor reproductive or obstetric history, third trimester: Secondary | ICD-10-CM

## 2017-02-14 DIAGNOSIS — Z87891 Personal history of nicotine dependence: Secondary | ICD-10-CM | POA: Diagnosis not present

## 2017-02-14 DIAGNOSIS — O99891 Other specified diseases and conditions complicating pregnancy: Secondary | ICD-10-CM

## 2017-02-14 DIAGNOSIS — O9989 Other specified diseases and conditions complicating pregnancy, childbirth and the puerperium: Secondary | ICD-10-CM | POA: Diagnosis not present

## 2017-02-14 DIAGNOSIS — Z3A29 29 weeks gestation of pregnancy: Secondary | ICD-10-CM | POA: Diagnosis not present

## 2017-02-14 DIAGNOSIS — Z880 Allergy status to penicillin: Secondary | ICD-10-CM | POA: Insufficient documentation

## 2017-02-14 DIAGNOSIS — M79606 Pain in leg, unspecified: Secondary | ICD-10-CM | POA: Diagnosis present

## 2017-02-14 NOTE — MAU Note (Signed)
Urine in the lab  

## 2017-02-14 NOTE — Discharge Instructions (Signed)
Leg Cramps Leg cramps occur when a muscle or muscles tighten and you have no control over this tightening (involuntary muscle contraction). Muscle cramps can develop in any muscle, but the most common place is in the calf muscles of the leg. Those cramps can occur during exercise or when you are at rest. Leg cramps are painful, and they may last for a few seconds to a few minutes. Cramps may return several times before they finally stop. Usually, leg cramps are not caused by a serious medical problem. In many cases, the cause is not known. Some common causes include:  Overexertion.  Overuse from repetitive motions, or doing the same thing over and over.  Remaining in a certain position for a long period of time.  Improper preparation, form, or technique while performing a sport or an activity.  Dehydration.  Injury.  Side effects of some medicines.  Abnormally low levels of the salts and ions in your blood (electrolytes), especially potassium and calcium. These levels could be low if you are taking water pills (diuretics) or if you are pregnant.  Follow these instructions at home: Watch your condition for any changes. Taking the following actions may help to lessen any discomfort that you are feeling:  Stay well-hydrated. Drink enough fluid to keep your urine clear or pale yellow.  Try massaging, stretching, and relaxing the affected muscle. Do this for several minutes at a time.  For tight or tense muscles, use a warm towel, heating pad, or hot shower water directed to the affected area.  If you are sore or have pain after a cramp, applying ice to the affected area may relieve discomfort. ? Put ice in a plastic bag. ? Place a towel between your skin and the bag. ? Leave the ice on for 20 minutes, 2-3 times per day.  Avoid strenuous exercise for several days if you have been having frequent leg cramps.  Make sure that your diet includes the essential minerals for your muscles to  work normally.  Take medicines only as directed by your health care provider.  Contact a health care provider if:  Your leg cramps get more severe or more frequent, or they do not improve over time.  Your foot becomes cold, numb, or blue. This information is not intended to replace advice given to you by your health care provider. Make sure you discuss any questions you have with your health care provider. Document Released: 08/30/2004 Document Revised: 12/29/2015 Document Reviewed: 06/30/2014 Elsevier Interactive Patient Education  2018 [employer].    Preterm Labor and Birth Information The normal length of a pregnancy is 39-41 weeks. Preterm labor is when labor starts before 37 completed weeks of pregnancy. What are the risk factors for preterm labor? Preterm labor is more likely to occur in women who:  Have certain infections during pregnancy such as a bladder infection, sexually transmitted infection, or infection inside the uterus (chorioamnionitis).  Have a shorter-than-normal cervix.  Have gone into preterm labor before.  Have had surgery on their cervix.  Are younger than age 2 or older than age 28.  Are African American.  Are pregnant with twins or multiple babies (multiple gestation).  Take street drugs or smoke while pregnant.  Do not gain enough weight while pregnant.  Became pregnant shortly after having been pregnant.  What are the symptoms of preterm labor? Symptoms of preterm labor include:  Cramps similar to those that can happen during a menstrual period. The cramps may happen with diarrhea.  Pain in the abdomen or lower back.  Regular uterine contractions that may feel like tightening of the abdomen.  A feeling of increased pressure in the pelvis.  Increased watery or bloody mucus discharge from the vagina.  Water breaking (ruptured amniotic sac).  Why is it important to recognize signs of preterm labor? It is important to recognize signs  of preterm labor because babies who are born prematurely may not be fully developed. This can put them at an increased risk for:  Long-term (chronic) heart and lung problems.  Difficulty immediately after birth with regulating body systems, including blood sugar, body temperature, heart rate, and breathing rate.  Bleeding in the brain.  Cerebral palsy.  Learning difficulties.  Death.  These risks are highest for babies who are born before 34 weeks of pregnancy. How is preterm labor treated? Treatment depends on the length of your pregnancy, your condition, and the health of your baby. It may involve:  Having a stitch (suture) placed in your cervix to prevent your cervix from opening too early (cerclage).  Taking or being given medicines, such as: ? Hormone medicines. These may be given early in pregnancy to help support the pregnancy. ? Medicine to stop contractions. ? Medicines to help mature the babys lungs. These may be prescribed if the risk of delivery is high. ? Medicines to prevent your baby from developing cerebral palsy.  If the labor happens before 34 weeks of pregnancy, you may need to stay in the hospital. What should I do if I think I am in preterm labor? If you think that you are going into preterm labor, call your health care provider right away. How can I prevent preterm labor in future pregnancies? To increase your chance of having a full-term pregnancy:  Do not use any tobacco products, such as cigarettes, chewing tobacco, and e-cigarettes. If you need help quitting, ask your health care provider.  Do not use street drugs or medicines that have not been prescribed to you during your pregnancy.  Talk with your health care provider before taking any herbal supplements, even if you have been taking them regularly.  Make sure you gain a healthy amount of weight during your pregnancy.  Watch for infection. If you think that you might have an infection, get it  checked right away.  Make sure to tell your health care provider if you have gone into preterm labor before.  This information is not intended to replace advice given to you by your health care provider. Make sure you discuss any questions you have with your health care provider. Document Released: 10/13/2003 Document Revised: 01/03/2016 Document Reviewed: 12/14/2015 Elsevier Interactive Patient Education  2018 [employer]

## 2017-02-14 NOTE — Progress Notes (Signed)
S: Patient reports she is having contractions- 3/hour at the most. She is also having leg cramp in R leg.       Pain was so bad last night that she could not move.  No pain today.   O: PE:                Right leg:  Good ROM.  No cords or calf tenderness.  A:  Right leg cramps.  R/O DVT  P: [facility]  Jordyan Hardiman A. Clearance CootsHarper MD

## 2017-02-14 NOTE — MAU Provider Note (Signed)
History     CSN: [id number]  Arrival date and time: 02/14/17 1607  First Provider Initiated Contact with Patient 02/14/17 2046      Chief Complaint  Patient presents with  . Leg Pain   HPI Lacey Hill is a 25 y.o. [id number] at [redacted]w[redacted]d who presents with leg cramps. Symptoms began last week. Reports leg cramps that occur at night & wakes her from sleep. Symptoms primarily in right calf. Currently no leg pain. Has not treated symptoms. Also reports abdominal cramping nightly after work. Occurs 3 times per hour. Denies abdominal pain today. Denies LOF or vaginal bleeding.  Denies leg swelling, cough, chest pain, or SOB. Positive fetal movement.   OB History    Gravida Para Term Preterm AB Living   4 3 1 2   3    SAB TAB Ectopic Multiple Live Births           3      Past Medical History:  Diagnosis Date  . Asthma   . Gestational diabetes   . Herpes     Past Surgical History:  Procedure Laterality Date  . TONSILLECTOMY  1995  . WISDOM TOOTH EXTRACTION  2017    Family History  Problem Relation Age of Onset  . Heart attack Mother   . Diabetes Mother   . Hypertension Mother   . Fibromyalgia Mother   . Cervical cancer Mother   . Colon cancer Paternal Grandfather     Social History  Substance Use Topics  . Smoking status: Former Smoker    Types: Cigarettes    Quit date: 2016  . Smokeless tobacco: Never Used  . Alcohol use No    Allergies:  Allergies  Allergen Reactions  . Penicillins     Facility-Administered Medications Prior to Admission  Medication Dose Route Frequency Provider Last Rate Last Dose  . hydroxyprogesterone caproate (MAKENA) 250 mg/mL injection 250 mg  250 mg Intramuscular Weekly Denney, Rachelle A, CNM   250 mg at 02/14/17 1545  . hydroxyprogesterone caproate (MAKENA) 250 mg/mL injection 250 mg  250 mg Intramuscular Once Denney, Rachelle A, CNM      . hydroxyprogesterone caproate (MAKENA) 250 mg/mL injection 250 mg  250 mg Intramuscular Once Constant,  Peggy, MD       Prescriptions Prior to Admission  Medication Sig Dispense Refill Last Dose  . acyclovir (ZOVIRAX) 200 MG capsule Take 200 mg by mouth 2 (two) times daily.   Not Taking  . albuterol (PROVENTIL HFA;VENTOLIN HFA) 108 (90 Base) MCG/ACT inhaler Inhale 1 puff into the lungs every 6 (six) hours as needed for wheezing or shortness of breath. 1 Inhaler 6 Taking  . famotidine (PEPCID) 20 MG tablet Take 1 tablet (20 mg total) by mouth 2 (two) times daily. 60 tablet 3 Taking    Review of Systems  Constitutional: Negative.   Respiratory: Negative for cough and shortness of breath.   Cardiovascular: Negative for chest pain and leg swelling.  Gastrointestinal: Positive for abdominal pain (at night after work; none today).  Genitourinary: Negative for vaginal bleeding and vaginal discharge.  Musculoskeletal:       + leg cramps   Physical Exam   Blood pressure 104/67, pulse 83, temperature 98.4 F (36.9 C), resp. rate 18, height 5' (1.524 m), weight 127 lb (57.6 kg), last menstrual period 07/24/2016.  Physical Exam  Nursing note and vitals reviewed. Constitutional: She is oriented to person, place, and time. She appears well-developed and well-nourished. No distress.  HENT:  Head:  Normocephalic and atraumatic.  Eyes: Conjunctivae are normal. Right eye exhibits no discharge. Left eye exhibits no discharge. No scleral icterus.  Neck: Normal range of motion.  Cardiovascular: Normal rate, regular rhythm and normal heart sounds.   No murmur heard. Respiratory: Effort normal and breath sounds normal. No respiratory distress. She has no wheezes.  Musculoskeletal: She exhibits no edema or tenderness.       Right lower leg: Normal. She exhibits no tenderness and no edema.       Left lower leg: She exhibits no tenderness, no swelling and no edema.  Negative homans. Calves equal size. No erythema or tenderness to palpation.   Neurological: She is alert and oriented to person, place, and  time.  Skin: Skin is warm and dry. She is not diaphoretic.  Psychiatric: She has a normal mood and affect. Her behavior is normal. Judgment and thought content normal.   Dilation: Closed Exam by:: Shahzaib Azevedo,NP  Fetal Tracing:  Baseline: 130 Variability: moderate Accelerations: 15x15 Decelerations: none  Toco: none MAU Course  Procedures No results found for this or any previous visit (from the past 24 hour(s)).  MDM Reactive NST No contractions, cervix closed/thick Pt asymptomatic No evidence of DVT  Assessment and Plan  A: 1. Leg cramps in pregnancy    P: Discharge home Discussed treatment of leg cramps at home including calf stretches, ice packs, adequate water intake Discussed reasons to return to MAU including s/s preterm labor F/u with OB  Judeth HornErin Amarrah Meinhart 02/14/2017, 8:45 PM

## 2017-02-14 NOTE — MAU Note (Signed)
Pt reports she has been having leg cramps in her right leg. Gets worse at night. Having pain in her leg now that hurts when she walks. Went to appointment today and was sent her for furhter evaluation.

## 2017-02-21 ENCOUNTER — Ambulatory Visit (INDEPENDENT_AMBULATORY_CARE_PROVIDER_SITE_OTHER): Payer: Medicaid Other | Admitting: Obstetrics & Gynecology

## 2017-02-21 ENCOUNTER — Encounter: Payer: Self-pay | Admitting: Obstetrics & Gynecology

## 2017-02-21 VITALS — BP 103/66 | HR 67 | Wt 127.0 lb

## 2017-02-21 DIAGNOSIS — O26843 Uterine size-date discrepancy, third trimester: Secondary | ICD-10-CM

## 2017-02-21 DIAGNOSIS — O0993 Supervision of high risk pregnancy, unspecified, third trimester: Secondary | ICD-10-CM

## 2017-02-21 DIAGNOSIS — O09219 Supervision of pregnancy with history of pre-term labor, unspecified trimester: Secondary | ICD-10-CM

## 2017-02-21 DIAGNOSIS — O09213 Supervision of pregnancy with history of pre-term labor, third trimester: Secondary | ICD-10-CM | POA: Diagnosis not present

## 2017-02-21 DIAGNOSIS — O0990 Supervision of high risk pregnancy, unspecified, unspecified trimester: Secondary | ICD-10-CM

## 2017-02-21 NOTE — Patient Instructions (Signed)
Return to clinic for any scheduled appointments or obstetric concerns, or go to MAU for evaluation  
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
## 2017-02-26 ENCOUNTER — Other Ambulatory Visit: Payer: Self-pay | Admitting: Certified Nurse Midwife

## 2017-02-28 ENCOUNTER — Ambulatory Visit (INDEPENDENT_AMBULATORY_CARE_PROVIDER_SITE_OTHER): Payer: Medicaid Other

## 2017-02-28 ENCOUNTER — Ambulatory Visit (HOSPITAL_COMMUNITY)
Admission: RE | Admit: 2017-02-28 | Discharge: 2017-02-28 | Disposition: A | Discharge status: Home / Self Care | Payer: Medicaid Other | Source: Ambulatory Visit | Attending: Certified Nurse Midwife | Admitting: Certified Nurse Midwife

## 2017-02-28 ENCOUNTER — Other Ambulatory Visit: Payer: Self-pay | Admitting: Certified Nurse Midwife

## 2017-02-28 DIAGNOSIS — O09213 Supervision of pregnancy with history of pre-term labor, third trimester: Secondary | ICD-10-CM | POA: Diagnosis not present

## 2017-02-28 DIAGNOSIS — Z3A31 31 weeks gestation of pregnancy: Secondary | ICD-10-CM

## 2017-02-28 DIAGNOSIS — O26843 Uterine size-date discrepancy, third trimester: Secondary | ICD-10-CM | POA: Diagnosis present

## 2017-02-28 DIAGNOSIS — O09293 Supervision of pregnancy with other poor reproductive or obstetric history, third trimester: Secondary | ICD-10-CM

## 2017-02-28 DIAGNOSIS — Z8632 Personal history of gestational diabetes: Secondary | ICD-10-CM | POA: Diagnosis not present

## 2017-02-28 DIAGNOSIS — O09299 Supervision of pregnancy with other poor reproductive or obstetric history, unspecified trimester: Secondary | ICD-10-CM

## 2017-02-28 MED ORDER — HYDROXYPROGESTERONE CAPROATE 250 MG/ML IM OIL
250.0000 mg | TOPICAL_OIL | Freq: Once | INTRAMUSCULAR | Status: AC
  Administered 2017-02-28: 250 mg via INTRAMUSCULAR

## 2017-02-28 NOTE — Progress Notes (Signed)
Pt here for a 17p injection. Injection given in left deltoid. Pt tolerated well. Pt advised to schedule appt for 1 week.

## 2017-03-01 ENCOUNTER — Other Ambulatory Visit: Payer: Self-pay | Admitting: Certified Nurse Midwife

## 2017-03-07 ENCOUNTER — Ambulatory Visit (INDEPENDENT_AMBULATORY_CARE_PROVIDER_SITE_OTHER): Payer: Medicaid Other | Admitting: Obstetrics and Gynecology

## 2017-03-07 VITALS — BP 112/77 | HR 87 | Wt 130.0 lb

## 2017-03-07 DIAGNOSIS — O09212 Supervision of pregnancy with history of pre-term labor, second trimester: Secondary | ICD-10-CM | POA: Diagnosis not present

## 2017-03-07 DIAGNOSIS — O0932 Supervision of pregnancy with insufficient antenatal care, second trimester: Secondary | ICD-10-CM

## 2017-03-07 DIAGNOSIS — O0992 Supervision of high risk pregnancy, unspecified, second trimester: Secondary | ICD-10-CM

## 2017-03-07 DIAGNOSIS — O09292 Supervision of pregnancy with other poor reproductive or obstetric history, second trimester: Secondary | ICD-10-CM

## 2017-03-07 DIAGNOSIS — O09293 Supervision of pregnancy with other poor reproductive or obstetric history, third trimester: Secondary | ICD-10-CM

## 2017-03-07 DIAGNOSIS — O09219 Supervision of pregnancy with history of pre-term labor, unspecified trimester: Secondary | ICD-10-CM

## 2017-03-07 DIAGNOSIS — O0990 Supervision of high risk pregnancy, unspecified, unspecified trimester: Secondary | ICD-10-CM
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
## 2017-03-07 NOTE — Progress Notes (Signed)
Patient is having irregular, strong contractions all day 6/10 pain.  Last night  they were 4 mins apart 10/10; but they stopped.  17P given in Left Deltiod. Tolerated well. Administrations This Visit    hydroxyprogesterone caproate (MAKENA) 250 mg/mL injection 250 mg    Admin Date 03/07/2017 Action Given Dose 250 mg Route Intramuscular Administered By Maretta BeesMcGlashan, Rolando Whitby J, RMA

## 2017-03-13 ENCOUNTER — Ambulatory Visit (INDEPENDENT_AMBULATORY_CARE_PROVIDER_SITE_OTHER): Payer: Medicaid Other

## 2017-03-13 DIAGNOSIS — O09213 Supervision of pregnancy with history of pre-term labor, third trimester: Secondary | ICD-10-CM | POA: Diagnosis not present

## 2017-03-13 DIAGNOSIS — O0990 Supervision of high risk pregnancy, unspecified, unspecified trimester: Secondary | ICD-10-CM

## 2017-03-14 MED ORDER — HYDROXYPROGESTERONE CAPROATE 250 MG/ML IM OIL: 250.0000 mg | TOPICAL_OIL | Freq: Once | INTRAMUSCULAR | Status: DC

## 2017-03-14 NOTE — Progress Notes (Signed)
Late entry was system down: Nurse visit for pt supplied 17p; pt requested inj to be given in  L del. Pt tolerated inj well. During visit, pt c/o irregular ctx's and noticed spotting after wiping today. Last IC was couple weeks ago per pt.  +FM, denies LOF. Consulted with Dr. Debroah LoopArnold. VE performed.

## 2017-03-18 ENCOUNTER — Encounter: Payer: Self-pay | Admitting: Obstetrics and Gynecology

## 2017-03-21 ENCOUNTER — Ambulatory Visit (INDEPENDENT_AMBULATORY_CARE_PROVIDER_SITE_OTHER): Payer: Medicaid Other | Admitting: Obstetrics and Gynecology

## 2017-03-21 VITALS — BP 125/72 | HR 84 | Wt 134.0 lb

## 2017-03-21 DIAGNOSIS — O361930 Maternal care for other isoimmunization, third trimester, not applicable or unspecified: Secondary | ICD-10-CM

## 2017-03-21 DIAGNOSIS — O09219 Supervision of pregnancy with history of pre-term labor, unspecified trimester: Secondary | ICD-10-CM

## 2017-03-21 DIAGNOSIS — O09213 Supervision of pregnancy with history of pre-term labor, third trimester: Secondary | ICD-10-CM

## 2017-03-21 DIAGNOSIS — O0990 Supervision of high risk pregnancy, unspecified, unspecified trimester: Secondary | ICD-10-CM

## 2017-03-21 DIAGNOSIS — O361920 Maternal care for other isoimmunization, second trimester, not applicable or unspecified: Secondary | ICD-10-CM

## 2017-03-21 DIAGNOSIS — O0993 Supervision of high risk pregnancy, unspecified, third trimester: Secondary | ICD-10-CM

## 2017-03-21 MED ORDER — HYDROXYPROGESTERONE CAPROATE 250 MG/ML IM OIL
250.0000 mg | TOPICAL_OIL | Freq: Once | INTRAMUSCULAR | Status: AC
  Administered 2017-03-21: 250 mg via INTRAMUSCULAR

## 2017-03-21 MED ORDER — VALACYCLOVIR HCL 500 MG PO TABS: ORAL_TABLET | ORAL | 5 refills | Status: AC
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
## 2017-03-21 NOTE — Progress Notes (Signed)
Pt c/o lightheadedness and dizzy this week. Pt preferred 17p in [place]. Used pt's supply.

## 2017-04-01 ENCOUNTER — Encounter (HOSPITAL_COMMUNITY)
Admission: AD | Disposition: A | Discharge status: Home / Self Care | Payer: Self-pay | Source: Ambulatory Visit | Attending: Obstetrics and Gynecology

## 2017-04-01 ENCOUNTER — Encounter (HOSPITAL_COMMUNITY): Payer: Self-pay

## 2017-04-01 ENCOUNTER — Inpatient Hospital Stay (HOSPITAL_COMMUNITY): Payer: Medicaid Other | Admitting: Anesthesiology

## 2017-04-01 ENCOUNTER — Inpatient Hospital Stay (HOSPITAL_COMMUNITY)
Admission: AD | Admit: 2017-04-01 | Discharge: 2017-04-03 | DRG: 767 | Disposition: A | Discharge status: Home / Self Care | Payer: Medicaid Other | Source: Ambulatory Visit | Attending: Obstetrics and Gynecology | Admitting: Obstetrics and Gynecology

## 2017-04-01 DIAGNOSIS — O9832 Other infections with a predominantly sexual mode of transmission complicating childbirth: Secondary | ICD-10-CM | POA: Diagnosis present

## 2017-04-01 DIAGNOSIS — Z302 Encounter for sterilization: Secondary | ICD-10-CM

## 2017-04-01 DIAGNOSIS — O9952 Diseases of the respiratory system complicating childbirth: Secondary | ICD-10-CM | POA: Diagnosis present

## 2017-04-01 DIAGNOSIS — Z880 Allergy status to penicillin: Secondary | ICD-10-CM

## 2017-04-01 DIAGNOSIS — Z3A35 35 weeks gestation of pregnancy: Secondary | ICD-10-CM

## 2017-04-01 DIAGNOSIS — J45909 Unspecified asthma, uncomplicated: Secondary | ICD-10-CM | POA: Diagnosis present

## 2017-04-01 DIAGNOSIS — O42013 Preterm premature rupture of membranes, onset of labor within 24 hours of rupture, third trimester: Principal | ICD-10-CM | POA: Diagnosis present

## 2017-04-01 DIAGNOSIS — Z87891 Personal history of nicotine dependence: Secondary | ICD-10-CM | POA: Diagnosis not present

## 2017-04-01 DIAGNOSIS — A6000 Herpesviral infection of urogenital system, unspecified: Secondary | ICD-10-CM | POA: Diagnosis present

## 2017-04-01 HISTORY — PX: TUBAL LIGATION: SHX77

## 2017-04-01 LAB — CBC
HCT: 35 % — ABNORMAL LOW (ref 36.0–46.0)
Hemoglobin: 12 g/dL (ref 12.0–15.0)
MCH: 31.8 pg (ref 26.0–34.0)
MCHC: 34.3 g/dL (ref 30.0–36.0)
MCV: 92.8 fL (ref 78.0–100.0)
PLATELETS: 238 10*3/uL (ref 150–400)
RBC: 3.77 MIL/uL — AB (ref 3.87–5.11)
RDW: 13.6 % (ref 11.5–15.5)
WBC: 8.7 10*3/uL (ref 4.0–10.5)

## 2017-04-01 LAB — RPR: RPR: NONREACTIVE

## 2017-04-01 LAB — OB RESULTS CONSOLE GBS: GBS: NEGATIVE

## 2017-04-01 LAB — GROUP B STREP BY PCR: GROUP B STREP BY PCR: NEGATIVE

## 2017-04-01 LAB — POCT FERN TEST: POCT Fern Test: POSITIVE

## 2017-04-01 SURGERY — LIGATION, FALLOPIAN TUBE, POSTPARTUM
Anesthesia: Choice

## 2017-04-01 MED ORDER — LACTATED RINGERS IV SOLN: 500.0000 mL | Freq: Once | INTRAVENOUS | Status: DC

## 2017-04-01 MED ORDER — IBUPROFEN 600 MG PO TABS
600.0000 mg | ORAL_TABLET | Freq: Four times a day (QID) | ORAL | Status: DC
  Administered 2017-04-01 – 2017-04-03 (×8): 600 mg via ORAL
  Filled 2017-04-01 (×8): qty 1

## 2017-04-01 MED ORDER — VALACYCLOVIR HCL 500 MG PO TABS
500.0000 mg | ORAL_TABLET | Freq: Every day | ORAL | Status: DC
  Administered 2017-04-02 – 2017-04-03 (×2): 500 mg via ORAL
  Filled 2017-04-01 (×2): qty 1

## 2017-04-01 MED ORDER — DIPHENHYDRAMINE HCL 25 MG PO CAPS: 25.0000 mg | ORAL_CAPSULE | Freq: Four times a day (QID) | ORAL | Status: DC | PRN

## 2017-04-01 MED ORDER — OXYTOCIN 40 UNITS IN LACTATED RINGERS INFUSION - SIMPLE MED
2.5000 [IU]/h | INTRAVENOUS | Status: DC
  Administered 2017-04-01: 2.5 [IU]/h via INTRAVENOUS
  Filled 2017-04-01: qty 1000

## 2017-04-01 MED ORDER — SODIUM BICARBONATE 8.4 % IV SOLN
INTRAVENOUS | Status: DC | PRN
  Administered 2017-04-01 (×5): 5 mL via EPIDURAL

## 2017-04-01 MED ORDER — DIPHENHYDRAMINE HCL 50 MG/ML IJ SOLN: 12.5000 mg | INTRAMUSCULAR | Status: DC | PRN

## 2017-04-01 MED ORDER — OXYTOCIN BOLUS FROM INFUSION
500.0000 mL | Freq: Once | INTRAVENOUS | Status: AC
  Administered 2017-04-01: 500 mL via INTRAVENOUS

## 2017-04-01 MED ORDER — DIBUCAINE 1 % RE OINT: 1.0000 "application " | TOPICAL_OINTMENT | RECTAL | Status: DC | PRN

## 2017-04-01 MED ORDER — SOD CITRATE-CITRIC ACID 500-334 MG/5ML PO SOLN
30.0000 mL | Freq: Once | ORAL | Status: AC
  Administered 2017-04-03: 30 mL via ORAL
  Filled 2017-04-01: qty 15

## 2017-04-01 MED ORDER — SIMETHICONE 80 MG PO CHEW: 80.0000 mg | CHEWABLE_TABLET | ORAL | Status: DC | PRN

## 2017-04-01 MED ORDER — ACETAMINOPHEN 325 MG PO TABS: 650.0000 mg | ORAL_TABLET | ORAL | Status: DC | PRN

## 2017-04-01 MED ORDER — CEFAZOLIN SODIUM-DEXTROSE 1-4 GM/50ML-% IV SOLN
1.0000 g | Freq: Three times a day (TID) | INTRAVENOUS | Status: DC
  Filled 2017-04-01: qty 50

## 2017-04-01 MED ORDER — CEFAZOLIN SODIUM-DEXTROSE 1-4 GM/50ML-% IV SOLN: 1.0000 g | Freq: Three times a day (TID) | INTRAVENOUS | Status: DC

## 2017-04-01 MED ORDER — ONDANSETRON HCL 4 MG/2ML IJ SOLN
INTRAMUSCULAR | Status: AC
  Filled 2017-04-01: qty 2

## 2017-04-01 MED ORDER — OXYCODONE HCL 5 MG PO TABS
5.0000 mg | ORAL_TABLET | ORAL | Status: DC | PRN
  Administered 2017-04-01: 5 mg via ORAL
  Filled 2017-04-01: qty 1

## 2017-04-01 MED ORDER — FENTANYL CITRATE (PF) 100 MCG/2ML IJ SOLN: 25.0000 ug | INTRAMUSCULAR | Status: DC | PRN

## 2017-04-01 MED ORDER — CEFAZOLIN SODIUM-DEXTROSE 2-4 GM/100ML-% IV SOLN: 2.0000 g | Freq: Once | INTRAVENOUS | Status: DC

## 2017-04-01 MED ORDER — ACETAMINOPHEN 325 MG PO TABS
650.0000 mg | ORAL_TABLET | ORAL | Status: DC | PRN
  Administered 2017-04-01 – 2017-04-02 (×3): 650 mg via ORAL
  Filled 2017-04-01 (×3): qty 2

## 2017-04-01 MED ORDER — PRENATAL MULTIVITAMIN CH
1.0000 | ORAL_TABLET | Freq: Every day | ORAL | Status: DC
  Administered 2017-04-03: 1 via ORAL
  Filled 2017-04-01 (×2): qty 1

## 2017-04-01 MED ORDER — LIDOCAINE-EPINEPHRINE (PF) 2 %-1:200000 IJ SOLN
INTRAMUSCULAR | Status: AC
  Filled 2017-04-01: qty 20

## 2017-04-01 MED ORDER — ONDANSETRON HCL 4 MG/2ML IJ SOLN: 4.0000 mg | INTRAMUSCULAR | Status: DC | PRN

## 2017-04-01 MED ORDER — MEPERIDINE HCL 25 MG/ML IJ SOLN: 6.2500 mg | INTRAMUSCULAR | Status: DC | PRN

## 2017-04-01 MED ORDER — PHENYLEPHRINE 40 MCG/ML (10ML) SYRINGE FOR IV PUSH (FOR BLOOD PRESSURE SUPPORT)
80.0000 ug | PREFILLED_SYRINGE | INTRAVENOUS | Status: DC | PRN
  Filled 2017-04-01: qty 10

## 2017-04-01 MED ORDER — FLEET ENEMA 7-19 GM/118ML RE ENEM: 1.0000 | ENEMA | RECTAL | Status: DC | PRN

## 2017-04-01 MED ORDER — BETAMETHASONE SOD PHOS & ACET 6 (3-3) MG/ML IJ SUSP
12.0000 mg | INTRAMUSCULAR | Status: DC
  Filled 2017-04-01 (×2): qty 2

## 2017-04-01 MED ORDER — WITCH HAZEL-GLYCERIN EX PADS: 1.0000 "application " | MEDICATED_PAD | CUTANEOUS | Status: DC | PRN

## 2017-04-01 MED ORDER — OXYCODONE-ACETAMINOPHEN 5-325 MG PO TABS: 1.0000 | ORAL_TABLET | ORAL | Status: DC | PRN

## 2017-04-01 MED ORDER — EPHEDRINE 5 MG/ML INJ: 10.0000 mg | INTRAVENOUS | Status: DC | PRN

## 2017-04-01 MED ORDER — LIDOCAINE HCL (CARDIAC) 20 MG/ML IV SOLN
INTRAVENOUS | Status: AC
  Filled 2017-04-01: qty 5

## 2017-04-01 MED ORDER — SODIUM BICARBONATE 8.4 % IV SOLN
INTRAVENOUS | Status: AC
  Filled 2017-04-01: qty 50

## 2017-04-01 MED ORDER — LIDOCAINE HCL (PF) 1 % IJ SOLN
INTRAMUSCULAR | Status: DC | PRN
  Administered 2017-04-01: 2 mL via EPIDURAL
  Administered 2017-04-01: 5 mL via EPIDURAL
  Administered 2017-04-01: 3 mL via EPIDURAL

## 2017-04-01 MED ORDER — MIDAZOLAM HCL 2 MG/2ML IJ SOLN
INTRAMUSCULAR | Status: DC | PRN
Start: 2017-04-01 — End: 2017-04-01
  Administered 2017-04-01: 2 mg via INTRAVENOUS

## 2017-04-01 MED ORDER — TETANUS-DIPHTH-ACELL PERTUSSIS 5-2.5-18.5 LF-MCG/0.5 IM SUSP: 0.5000 mL | Freq: Once | INTRAMUSCULAR | Status: DC

## 2017-04-01 MED ORDER — COCONUT OIL OIL
1.0000 "application " | TOPICAL_OIL | Status: DC | PRN
  Administered 2017-04-01: 1 via TOPICAL
  Filled 2017-04-01: qty 120

## 2017-04-01 MED ORDER — ONDANSETRON HCL 4 MG/2ML IJ SOLN: 4.0000 mg | Freq: Once | INTRAMUSCULAR | Status: DC | PRN

## 2017-04-01 MED ORDER — SOD CITRATE-CITRIC ACID 500-334 MG/5ML PO SOLN
30.0000 mL | ORAL | Status: DC | PRN
  Administered 2017-04-01: 30 mL via ORAL
  Filled 2017-04-01: qty 15

## 2017-04-01 MED ORDER — FENTANYL CITRATE (PF) 100 MCG/2ML IJ SOLN
INTRAMUSCULAR | Status: AC
  Filled 2017-04-01: qty 2

## 2017-04-01 MED ORDER — PHENYLEPHRINE 40 MCG/ML (10ML) SYRINGE FOR IV PUSH (FOR BLOOD PRESSURE SUPPORT): 80.0000 ug | PREFILLED_SYRINGE | INTRAVENOUS | Status: DC | PRN

## 2017-04-01 MED ORDER — ONDANSETRON HCL 4 MG/2ML IJ SOLN
4.0000 mg | Freq: Four times a day (QID) | INTRAMUSCULAR | Status: DC | PRN
  Administered 2017-04-01: 4 mg via INTRAVENOUS
  Filled 2017-04-01: qty 2

## 2017-04-01 MED ORDER — BUPIVACAINE HCL (PF) 0.25 % IJ SOLN
INTRAMUSCULAR | Status: DC | PRN
  Administered 2017-04-01: 30 mL

## 2017-04-01 MED ORDER — PROPOFOL 10 MG/ML IV BOLUS
INTRAVENOUS | Status: AC
  Filled 2017-04-01: qty 20

## 2017-04-01 MED ORDER — MIDAZOLAM HCL 2 MG/2ML IJ SOLN
INTRAMUSCULAR | Status: AC
  Filled 2017-04-01: qty 2

## 2017-04-01 MED ORDER — LACTATED RINGERS IV SOLN
500.0000 mL | INTRAVENOUS | Status: DC | PRN
  Administered 2017-04-01: 500 mL via INTRAVENOUS

## 2017-04-01 MED ORDER — LACTATED RINGERS IV SOLN
INTRAVENOUS | Status: DC
  Administered 2017-04-01 (×2): via INTRAVENOUS

## 2017-04-01 MED ORDER — BUPIVACAINE HCL (PF) 0.25 % IJ SOLN
INTRAMUSCULAR | Status: AC
  Filled 2017-04-01: qty 30

## 2017-04-01 MED ORDER — SENNOSIDES-DOCUSATE SODIUM 8.6-50 MG PO TABS
2.0000 | ORAL_TABLET | ORAL | Status: DC
  Administered 2017-04-01 – 2017-04-03 (×2): 2 via ORAL
  Filled 2017-04-01 (×2): qty 2

## 2017-04-01 MED ORDER — FENTANYL CITRATE (PF) 100 MCG/2ML IJ SOLN
INTRAMUSCULAR | Status: DC | PRN
  Administered 2017-04-01 (×2): 50 ug via INTRAVENOUS

## 2017-04-01 MED ORDER — FENTANYL 2.5 MCG/ML BUPIVACAINE 1/10 % EPIDURAL INFUSION (WH - ANES)
14.0000 mL/h | INTRAMUSCULAR | Status: DC | PRN
  Administered 2017-04-01: 12 mL/h via EPIDURAL
  Filled 2017-04-01: qty 100

## 2017-04-01 MED ORDER — BENZOCAINE-MENTHOL 20-0.5 % EX AERO: 1.0000 "application " | INHALATION_SPRAY | CUTANEOUS | Status: DC | PRN

## 2017-04-01 MED ORDER — OXYCODONE-ACETAMINOPHEN 5-325 MG PO TABS: 2.0000 | ORAL_TABLET | ORAL | Status: DC | PRN

## 2017-04-01 MED ORDER — ZOLPIDEM TARTRATE 5 MG PO TABS: 5.0000 mg | ORAL_TABLET | Freq: Every evening | ORAL | Status: DC | PRN

## 2017-04-01 MED ORDER — ONDANSETRON HCL 4 MG PO TABS: 4.0000 mg | ORAL_TABLET | ORAL | Status: DC | PRN

## 2017-04-01 MED ORDER — LIDOCAINE HCL (PF) 1 % IJ SOLN
30.0000 mL | INTRAMUSCULAR | Status: DC | PRN
  Filled 2017-04-01: qty 30

## 2017-04-01 SURGICAL SUPPLY — 24 items
BLADE SURG 11 STRL SS (BLADE) ×3 IMPLANT
CLIP FILSHIE TUBAL LIGA STRL (Clip) ×3 IMPLANT
CLOTH BEACON ORANGE TIMEOUT ST (SAFETY) ×3 IMPLANT
DERMABOND ADHESIVE PROPEN (GAUZE/BANDAGES/DRESSINGS) ×2
DERMABOND ADVANCED .7 DNX6 (GAUZE/BANDAGES/DRESSINGS) ×1 IMPLANT
DRSG OPSITE POSTOP 3X4 (GAUZE/BANDAGES/DRESSINGS) ×3 IMPLANT
DURAPREP 26ML APPLICATOR (WOUND CARE) ×3 IMPLANT
GLOVE BIO SURGEON STRL SZ 6.5 (GLOVE) ×2 IMPLANT
GLOVE BIO SURGEONS STRL SZ 6.5 (GLOVE) ×1
GLOVE BIOGEL PI IND STRL 7.0 (GLOVE) ×2 IMPLANT
GLOVE BIOGEL PI INDICATOR 7.0 (GLOVE) ×4
GOWN STRL REUS W/TWL LRG LVL3 (GOWN DISPOSABLE) ×6 IMPLANT
NEEDLE HYPO 22GX1.5 SAFETY (NEEDLE) ×3 IMPLANT
NS IRRIG 1000ML POUR BTL (IV SOLUTION) ×3 IMPLANT
PACK ABDOMINAL MINOR (CUSTOM PROCEDURE TRAY) ×3 IMPLANT
PROTECTOR NERVE ULNAR (MISCELLANEOUS) ×3 IMPLANT
SPONGE LAP 4X18 X RAY DECT (DISPOSABLE) IMPLANT
SUT VIC AB 0 CT1 27 (SUTURE) ×2
SUT VIC AB 0 CT1 27XBRD ANBCTR (SUTURE) ×1 IMPLANT
SUT VICRYL 4-0 PS2 18IN ABS (SUTURE) ×3 IMPLANT
SYR CONTROL 10ML LL (SYRINGE) ×3 IMPLANT
TOWEL OR 17X24 6PK STRL BLUE (TOWEL DISPOSABLE) ×6 IMPLANT
TRAY FOLEY CATH SILVER 16FR (SET/KITS/TRAYS/PACK) ×3 IMPLANT
WATER STERILE IRR 1000ML POUR (IV SOLUTION) ×3 IMPLANT

## 2017-04-01 NOTE — Anesthesia Postprocedure Evaluation (Signed)
Anesthesia Post Note  Patient: Lacey Hill  Procedure(s) Performed: * No procedures listed *     Patient location during evaluation: Mother Baby Anesthesia Type: Epidural Level of consciousness: awake and alert and oriented Pain management: satisfactory to patient Vital Signs Assessment: post-procedure vital signs reviewed and stable Respiratory status: spontaneous breathing and nonlabored ventilation Cardiovascular status: stable Postop Assessment: no headache, no backache, no signs of nausea or vomiting, adequate PO intake and patient able to bend at knees (patient up walking) Anesthetic complications: no    Last Vitals:  Vitals:   04/01/17 1311 04/01/17 1400  BP: 125/67 118/62  Pulse: 68 70  Resp: 18 16  Temp: 36.7 C 36.8 C  SpO2: 100% 100%    Last Pain:  Vitals:   04/01/17 1400  TempSrc: Oral  PainSc:    Pain Goal:                 [name]

## 2017-04-01 NOTE — Transfer of Care (Signed)
Immediate Anesthesia Transfer of Care Note  Patient: Lacey Hill  Procedure(s) Performed: Procedure(s): POST PARTUM TUBAL LIGATION (N/A)  Patient Location: PACU  Anesthesia Type:Epidural  Level of Consciousness: sedated  Airway & Oxygen Therapy: Patient Spontanous Breathing  Post-op Assessment: Report given to RN and Post -op Vital signs reviewed and stable  Post vital signs: Reviewed and stable  Last Vitals:  Vitals:   04/01/17 0829 04/01/17 0835  BP: 128/77   Pulse: (!) 59   Resp:  16  Temp:    SpO2:      Last Pain:  Vitals:   04/01/17 0835  TempSrc:   PainSc: 0-No pain         Complications: No apparent anesthesia complications

## 2017-04-01 NOTE — Anesthesia Postprocedure Evaluation (Signed)
Anesthesia Post Note  Patient: Lacey Hill  Procedure(s) Performed: Procedure(s) (LRB): POST PARTUM TUBAL LIGATION (N/A)     Patient location during evaluation: Mother Baby Anesthesia Type: Epidural Level of consciousness: awake and alert and oriented Pain management: satisfactory to patient Vital Signs Assessment: post-procedure vital signs reviewed and stable Respiratory status: spontaneous breathing and nonlabored ventilation Cardiovascular status: stable Postop Assessment: no headache, no backache, no signs of nausea or vomiting, adequate PO intake and patient able to bend at knees (patient up walking) Anesthetic complications: no    Last Vitals:  Vitals:   04/01/17 1311 04/01/17 1400  BP: 125/67 118/62  Pulse: 68 70  Resp: 18 16  Temp: 36.7 C 36.8 C  SpO2: 100% 100%    Last Pain:  Vitals:   04/01/17 1400  TempSrc: Oral  PainSc:    Pain Goal:                 Caylen Yardley

## 2017-04-01 NOTE — MAU Note (Signed)
Pt states water broke at 0050-clear fluid. Pt reports irregular contractions. Denies vaginal bleeding. Reports good fetal movement.

## 2017-04-01 NOTE — Anesthesia Preprocedure Evaluation (Signed)
Anesthesia Evaluation  Patient identified by MRN, date of birth, ID band Patient awake    Reviewed: Allergy & Precautions, NPO status , Patient's Chart, lab work & pertinent test results  Airway Mallampati: II  TM Distance: >3 FB Neck ROM: Full    Dental  (+) Teeth Intact, Dental Advisory Given   Pulmonary asthma , former smoker,    Pulmonary exam normal breath sounds clear to auscultation       Cardiovascular negative cardio ROS Normal cardiovascular exam Rhythm:Regular Rate:Normal     Neuro/Psych negative neurological ROS  negative psych ROS   GI/Hepatic negative GI ROS, Neg liver ROS,   Endo/Other    Renal/GU negative Renal ROS     Musculoskeletal negative musculoskeletal ROS (+)   Abdominal   Peds  Hematology negative hematology ROS (+)   Anesthesia Other Findings Day of surgery medications reviewed with the patient.  Reproductive/Obstetrics (+) Pregnancy H/o GDM with prior pregnancy                             Anesthesia Physical Anesthesia Plan  ASA: II  Anesthesia Plan: Epidural   Post-op Pain Management:    Induction:   PONV Risk Score and Plan: Treatment may vary due to age or medical condition  Airway Management Planned:   Additional Equipment:   Intra-op Plan:   Post-operative Plan:   Informed Consent: I have reviewed the patients History and Physical, chart, labs and discussed the procedure including the risks, benefits and alternatives for the proposed anesthesia with the patient or authorized representative who has indicated his/her understanding and acceptance.   Dental advisory given  Plan Discussed with:   Anesthesia Plan Comments: (Patient identified. Risks/Benefits/Options discussed with patient including but not limited to bleeding, infection, nerve damage, paralysis, failed block, incomplete pain control, headache, blood pressure changes, nausea,  vomiting, reactions to medication both or allergic, itching and postpartum back pain. Confirmed with bedside nurse the patient's most recent platelet count. Confirmed with patient that they are not currently taking any anticoagulation, have any bleeding history or any family history of bleeding disorders. Patient expressed understanding and wished to proceed. All questions were answered. )        Anesthesia Quick Evaluation

## 2017-04-01 NOTE — Anesthesia Procedure Notes (Signed)
Epidural Patient location during procedure: OB Start time: 04/01/2017 4:27 AM End time: 04/01/2017 4:33 AM  Staffing Anesthesiologist: Cecile Hearing Performed: anesthesiologist   Preanesthetic Checklist Completed: patient identified, pre-op evaluation, timeout performed, IV checked, risks and benefits discussed and monitors and equipment checked  Epidural Patient position: sitting Prep: DuraPrep Patient monitoring: blood pressure and continuous pulse ox Approach: midline Location: L3-L4 Injection technique: LOR air  Needle:  Needle type: Tuohy  Needle gauge: 17 G Needle length: 9 cm Needle insertion depth: 3.5 cm Catheter size: 19 Gauge Catheter at skin depth: 8.5 cm Test dose: negative and Other (1% Lidocaine)  Additional Notes Patient identified.  Risk benefits discussed including failed block, incomplete pain control, headache, nerve damage, paralysis, blood pressure changes, nausea, vomiting, reactions to medication both toxic or allergic, and postpartum back pain.  Patient expressed understanding and wished to proceed.  All questions were answered.  Sterile technique used throughout procedure and epidural site dressed with sterile barrier dressing. No paresthesia or other complications noted. The patient did not experience any signs of intravascular injection such as tinnitus or metallic taste in mouth nor signs of intrathecal spread such as rapid motor block. Please see nursing notes for vital signs. Reason for block:procedure for pain

## 2017-04-01 NOTE — Addendum Note (Signed)
Addendum  created 04/01/17 1437 by Shanon Payor, CRNA   Charge Capture section accepted, Sign clinical note

## 2017-04-01 NOTE — Lactation Note (Signed)
This note was copied from a baby's chart. Lactation Consultation Note  Patient Name: Lacey Hill [id number] Date: 04/01/2017 Reason for consult: Initial assessment;Late-preterm 34-36.6wks;Infant < 6lbs. I brought baby to mom from the CNS, where he had been under the warmer, now had temp and blood sugars  WNL. He was awake and showing feeding cues, and due to be fed.  Mom had recently returned from a BTL, and when handed the baby, she immediately latched him, and he latched deeply, great breast movement, and stayed with active sucking for the whole 22 minutes, and unlatched on his own. Mom is going to pump with DEP, 21 flanges, and use this to supplement the baby, as opposed to formula, at this time, if possible.  Mom would prefer not using formula, if she can help it.  LPI feeding policy reviewed with mom, as well as use of DEP. Lactation and basic breast feeding teaching begun. Mom knows to call for questions/conerns.    Maternal Data Formula Feeding for Exclusion: Yes (mom would like to avoid formula if possible, but is she cant pump enough to supplemetn brteast, Alimentum will be used) Has patient been taught Hand Expression?: Yes Does the patient have breastfeeding experience prior to this delivery?: Yes  Feeding Feeding Type: Breast Fed Nipple Type: Slow - flow Length of feed: 22 min  LATCH Score Latch: Grasps breast easily, tongue down, lips flanged, rhythmical sucking.  Audible Swallowing: Spontaneous and intermittent  Type of Nipple: Everted at rest and after stimulation  Comfort (Breast/Nipple): Soft / non-tender  Hold (Positioning): No assistance needed to correctly position infant at breast.  LATCH Score: 10  Interventions Interventions: Breast feeding basics reviewed;Skin to skin;Hand express;Coconut oil;Hand pump;DEBP  Lactation Tools Discussed/Used Pump Review: Setup, frequency, and cleaning;Milk Storage;Other (comment) (hand expression, pump settings, and use, LPI  feeding poicy) Initiated by:: Danton Clap, RN IBCLC Date initiated:: 04/01/17   Consult Status Consult Status: Follow-up Date: 04/02/17 Follow-up type: In-patient    Alfred Levins 04/01/2017, 3:18 PM
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
## 2017-04-01 NOTE — Anesthesia Postprocedure Evaluation (Signed)
Anesthesia Post Note  Patient: Lacey Hill  Procedure(s) Performed: Procedure(s) (LRB): POST PARTUM TUBAL LIGATION (N/A)     Patient location during evaluation: Mother Baby Anesthesia Type: Epidural Level of consciousness: awake and alert Pain management: pain level controlled Vital Signs Assessment: post-procedure vital signs reviewed and stable Respiratory status: spontaneous breathing, nonlabored ventilation and respiratory function stable Cardiovascular status: stable Postop Assessment: no headache, no backache and epidural receding Anesthetic complications: no    Last Vitals:  Vitals:   04/01/17 0835 04/01/17 1000  BP:  102/68  Pulse:  63  Resp: 16 12  Temp:  (!) 36.2 C  SpO2:  100%    Last Pain:  Vitals:   04/01/17 0835  TempSrc:   PainSc: 0-No pain   Pain Goal:                 Dujuan Stankowski

## 2017-04-01 NOTE — Anesthesia Preprocedure Evaluation (Signed)
Anesthesia Evaluation  Patient identified by MRN, date of birth, ID band Patient awake    Reviewed: Allergy & Precautions, NPO status , Patient's Chart, lab work & pertinent test results  Airway Mallampati: II  TM Distance: >3 FB Neck ROM: Full    Dental  (+) Teeth Intact, Dental Advisory Given   Pulmonary asthma , former smoker,    Pulmonary exam normal breath sounds clear to auscultation       Cardiovascular negative cardio ROS Normal cardiovascular exam Rhythm:Regular Rate:Normal     Neuro/Psych negative neurological ROS  negative psych ROS   GI/Hepatic negative GI ROS, Neg liver ROS,   Endo/Other  diabetes  Renal/GU negative Renal ROS     Musculoskeletal negative musculoskeletal ROS (+)   Abdominal   Peds  Hematology negative hematology ROS (+)   Anesthesia Other Findings Day of surgery medications reviewed with the patient.  Reproductive/Obstetrics (+) Pregnancy H/o GDM with prior pregnancy                             Anesthesia Physical  Anesthesia Plan  ASA: II  Anesthesia Plan: Epidural   Post-op Pain Management:    Induction:   PONV Risk Score and Plan: Treatment may vary due to age or medical condition  Airway Management Planned:   Additional Equipment:   Intra-op Plan:   Post-operative Plan:   Informed Consent: I have reviewed the patients History and Physical, chart, labs and discussed the procedure including the risks, benefits and alternatives for the proposed anesthesia with the patient or authorized representative who has indicated his/her understanding and acceptance.   Dental advisory given  Plan Discussed with:   Anesthesia Plan Comments: (Patient identified. Risks/Benefits/Options discussed with patient including but not limited to bleeding, infection, nerve damage, paralysis, failed block, incomplete pain control, headache, blood pressure changes,  nausea, vomiting, reactions to medication both or allergic, itching and postpartum back pain. Confirmed with bedside nurse the patient's most recent platelet count. Confirmed with patient that they are not currently taking any anticoagulation, have any bleeding history or any family history of bleeding disorders. Patient expressed understanding and wished to proceed. All questions were answered. )        Anesthesia Quick Evaluation

## 2017-04-01 NOTE — Op Note (Signed)
Lacey Hill 04/01/2017  PREOPERATIVE DIAGNOSIS:  Multiparity, undesired fertility  POSTOPERATIVE DIAGNOSIS:  Multiparity, undesired fertility  PROCEDURE:  Postpartum Bilateral Tubal Sterilization using Filshie Clips   ANESTHESIA:  Epidural  COMPLICATIONS:  None immediate.  ESTIMATED BLOOD LOSS:  Less than 20 ml.  FLUIDS: 1000 ml LR.  URINE OUTPUT:  100 ml of clear urine.  INDICATIONS: 26 y.o. [id number]  with undesired fertility,status post vaginal delivery, desires permanent sterilization. Risks and benefits of procedure discussed with patient including permanence of method, bleeding, infection, injury to surrounding organs and need for additional procedures. Risk failure of 0.5-1% with increased risk of ectopic gestation if pregnancy occurs was also discussed with patient.   FINDINGS:  Normal uterus, tubes, and ovaries.  TECHNIQUE:  The patient was taken to the operating room where her epidural anesthesia was dosed up to surgical level and found to be adequate.  She was then placed in the dorsal supine position and prepped and draped in sterile fashion.  After an adequate timeout was performed, attention was turned to the patient's abdomen where a small transverse skin incision was made under the umbilical fold. The incision was taken down to the layer of fascia using the scalpel, and fascia was incised, and extended bilaterally using Mayo scissors. The peritoneum was entered in a sharp fashion. Attention was then turned to the patient's uterus, and left fallopian tube was identified and followed out to the fimbriated end.  A Filshie clip was placed on the left fallopian tube about 2 cm from the cornual attachment, with care given to incorporate the underlying mesosalpinx.  A similar process was carried out on the right side allowing for bilateral tubal sterilization.  Good hemostasis was noted overall.  Local analgesia was drizzled on both operative sites.The instruments were then removed from  the patient's abdomen and the fascial incision was repaired with 0 Vicryl, and the skin was closed with a 4-0 Vicryl subcuticular stitch. Liquiband was applied. The patient tolerated the procedure well.  Sponge, lap, and needle counts were correct times two.  The patient was then taken to the recovery room awake, extubated and in stable condition.  Scheryl Darter MD 04/01/2017 9:40 AM

## 2017-04-02 ENCOUNTER — Encounter (HOSPITAL_COMMUNITY): Payer: Self-pay | Admitting: Obstetrics & Gynecology

## 2017-04-02 NOTE — Lactation Note (Signed)
This note was copied from a baby's chart. Lactation Consultation Note  Patient Name: Boy Romaine Kemmer KHVFM'B Date: 04/02/2017 Reason for consult: Follow-up assessment;Late-preterm 34-36.6wks;Infant < 6lbs;Nipple pain/trauma   Follow up with mom of 31 hour old LPT infant. Infant with 6 BF for 15-30 minutes, EBM x 4 of 8-10 ml, formula x 2, 4 voids and 4 stools in last 24 hours. Infant weight 4 lb 7.4 oz with 2% weight loss since birth. LATCH scores 7-10.   Infant was stirring and mom getting ready to feed him. Mom reports she feels infant is feeding well. She reports she is pumping post BF and getting colostrum to feed infant. Mom reports her nipples are sore and most notably with pumping. Nipples appear to have faint bruising to them, nipple tissue intact. She is starting pump on 4 gtts and is using coconut oil to nipples. Mom is using # 21 flanges with pumping.   Infant would not latch initially, enc mom to burp infant and she did and he then latched well. Mom independent in latching infant. Infant was still feeding when LC Left room. Enc mom to continue feeding infant at least every 3 hours and to limit feeding to 30 minutes total including breastfeeding and supplementation.   Enc mom to BF infant at least every 3 hours. Enc her to apply EBM to nipples post BF and then apply comfort gels. Reviewed use and cleaning of comfort gels. Advised mom to lubricate flanges with coconut oil before pumping and to use # 24 flanges for pumping to see if comfort increases. Mom reports if she decreases suction to lower level that the milk does not flow as well. Enc her to pump at comfortable level. Mom receptive to all teaching. Enc mom to call out for feeding assistance as needed. Mom without further questions/concerns at this time.      Maternal Data Formula Feeding for Exclusion: No Has patient been taught Hand Expression?: Yes Does the patient have breastfeeding experience prior to this delivery?:  Yes  Feeding Feeding Type: Breast Fed Length of feed:  (still BF when LC left room)  LATCH Score Latch: Grasps breast easily, tongue down, lips flanged, rhythmical sucking.  Audible Swallowing: A few with stimulation  Type of Nipple: Everted at rest and after stimulation  Comfort (Breast/Nipple): Filling, red/small blisters or bruises, mild/mod discomfort  Hold (Positioning): No assistance needed to correctly position infant at breast.  LATCH Score: 8  Interventions Interventions: Support pillows;Coconut oil;Comfort gels;DEBP  Lactation Tools Discussed/Used Tools: Comfort gels;Pump Breast pump type: Double-Electric Breast Pump Pump Review: Setup, frequency, and cleaning;Milk Storage Initiated by:: Reviewed and encouraged   Consult Status Consult Status: Follow-up Date: 04/03/17 Follow-up type: In-patient    Silas Flood Harlen Danford 04/02/2017, 1:40 PM

## 2017-04-02 NOTE — Progress Notes (Signed)
CSW received consult for [facility] and hx of trauma as a child.  CSW reviewed medical record and notes that [facility] started at 21 weeks, which does not qualify for an automatic CSW consult.  CSW does not feel it is appropriate to discuss childhood trauma at this time, but asks to be called if current concerns arise, by MOB's request of if she scores 9 or greater or yes to question 10 on the Edinburgh Postpartum Depression Scale.

## 2017-04-02 NOTE — Plan of Care (Signed)
Problem: Skin Integrity: Goal: Demonstration of wound healing without infection will improve Outcome: Progressing Incision site for BTL shows no s/s of infection.

## 2017-04-02 NOTE — Progress Notes (Signed)
Post Partum Day 1 Subjective: no complaints.no bm or flatus. Tolerating po. Ambulating fine.  Objective: Blood pressure 92/60, pulse 72, temperature 98.1 F (36.7 C), resp. rate 18, height 5' (1.524 m), weight 64.9 kg (143 lb), last menstrual period 07/24/2016, SpO2 99 %, unknown if currently breastfeeding.  Physical Exam:  General: alert Lochia: appropriate Uterine Fundus: firm DVT Evaluation: No evidence of DVT seen on physical exam.   Recent Labs  04/01/17 0230  HGB 12.0  HCT 35.0*    Assessment/Plan: Plan for discharge tomorrow   LOS: 1 day   Anna Genre 04/02/2017, 7:36 AM

## 2017-04-03 ENCOUNTER — Encounter: Payer: Medicaid Other | Admitting: Obstetrics & Gynecology

## 2017-04-03 MED ORDER — IBUPROFEN 600 MG PO TABS: 600.0000 mg | ORAL_TABLET | Freq: Four times a day (QID) | ORAL | 0 refills | Status: AC

## 2017-04-03 MED ORDER — PRENATAL MULTIVITAMIN CH: 1.0000 | ORAL_TABLET | Freq: Every day | ORAL | 0 refills | Status: DC

## 2017-04-03 NOTE — Discharge Summary (Signed)
OB Discharge Summary     Patient Name: Lacey Hill DOB: April 17, 1991 MRN: [id number]  Date of admission: 04/01/2017 Delivering MD: Pincus Large   Date of discharge: 04/03/2017  Admitting diagnosis: 35.6 WEEKS POSSIBLE ROM Post Partum Tubal Ligation  Intrauterine pregnancy: [redacted]w[redacted]d     Secondary diagnosis:  Active Problems:   Preterm labor  Additional problems: none     Discharge diagnosis: Preterm Pregnancy Delivered                                                                                                Post partum procedures:postpartum tubal ligation  Augmentation: Pitocin  Complications: none  Hospital course:  Onset of Labor With Vaginal Delivery     26 y.o. yo [id number] at [redacted]w[redacted]d was admitted in Latent Labor on 04/01/2017. Patient had an uncomplicated labor course as follows:  Membrane Rupture Time/Date: 12:50 AM ,04/01/2017   Intrapartum Procedures: Episiotomy: None [1]                                         Lacerations:  None [1]  Patient had a delivery of a Viable infant. 04/01/2017  Information for the patient's newborn:  Sabeen, Cassiano [[id number]]  Delivery Method: Vag-Spont    Pateint had an uncomplicated postpartum course.  She is ambulating, tolerating a regular diet, passing flatus, and urinating well. Patient is discharged home in stable condition on 04/03/17.   Physical exam  Vitals:   04/02/17 0516 04/02/17 0940 04/02/17 1842 04/03/17 0547  BP: 92/60 (!) 106/54 110/64 (!) 107/56  Pulse: 72 78 72 67  Resp: 18 16 17 18   Temp: 98.1 F (36.7 C) 98.2 F (36.8 C)  98.6 F (37 C)  TempSrc:  Oral Oral Oral  SpO2: 99% 98%    Weight:      Height:       General: alert, cooperative and no distress Lochia: appropriate Uterine Fundus: firm DVT Evaluation: No evidence of DVT seen on physical exam. Labs: Lab Results  Component Value Date   WBC 8.7 04/01/2017   HGB 12.0 04/01/2017   HCT 35.0 (L) 04/01/2017   MCV 92.8 04/01/2017   PLT 238 04/01/2017    No flowsheet data found.  Discharge instruction: per After Visit Summary and "Baby and Me Booklet".  After visit meds:  Allergies as of 04/03/2017      Reactions   Penicillins    Unknown from childhood.      Medication List    TAKE these medications   acetaminophen 500 MG tablet Commonly known as:  TYLENOL Take 500 mg by mouth every 6 (six) hours as needed for moderate pain or headache.   albuterol 108 (90 Base) MCG/ACT inhaler Commonly known as:  PROVENTIL HFA;VENTOLIN HFA Inhale 1 puff into the lungs every 6 (six) hours as needed for wheezing or shortness of breath.   famotidine 20 MG tablet Commonly known as:  PEPCID Take 1 tablet (20 mg total) by mouth 2 (two) times daily.  ibuprofen 600 MG tablet Commonly known as:  ADVIL,MOTRIN Take 1 tablet (600 mg total) by mouth every 6 (six) hours.   prenatal multivitamin Tabs tablet Take 1 tablet by mouth daily at 12 noon.   valACYclovir 500 MG tablet Commonly known as:  VALTREX Take two tablets by mouth twice daily for ten days; then one tablet twice daily for remainder of pregnancy            Discharge Care Instructions        Start     Ordered   04/03/17 0000  ibuprofen (ADVIL,MOTRIN) 600 MG tablet  Every 6 hours     04/03/17 0751   04/03/17 0000  Prenatal Vit-Fe Fumarate-FA (PRENATAL MULTIVITAMIN) TABS tablet  Daily     04/03/17 0751   04/01/17 0000  OB RESULT CONSOLE Group B Strep    Comments:  This external order was created through the Results Console.   04/01/17 0857      Diet: routine diet  Activity: Advance as tolerated. Pelvic rest for 6 weeks.   Outpatient follow up:4-6 weeks Follow up Appt:No future appointments. Follow up Visit:No Follow-up on file.  Postpartum contraception: Tubal Ligation  Newborn Data: Live born female  Birth Weight: 4 lb 8.8 oz (2065 g) APGAR: 8, 9  Baby Feeding: Breast Disposition:home with mother   04/03/2017 Anna Genre, Student-PA  I spoke with and  examined patient and agree with resident/PA/SNM's note and plan of care.  PPD#2 from NSVB @ [redacted]w[redacted]d d/t PTL, POD#2 from pp BTL.  Eating, drinking, voiding, ambulating well.  +flatus.  Lochia and pain wnl.  Denies dizziness, lightheadedness, or sob. No complaints. Breast and bottlefeeding. Declines circ.  VSS FF u-2 Lochia normal No s/s DVT F/U 4wks for pp visit Cheral Marker, CNM, [facility] 04/03/2017 9:16 AM

## 2017-04-03 NOTE — Lactation Note (Signed)
This note was copied from a baby's chart. Lactation Consultation Note  Patient Name: Lacey Hill ZOXWR'UToday's Date: 04/03/2017 Reason for consult: Follow-up assessment Baby at 52 hr of life. Upon entry baby was latched. Mom is reporting bilateral nipple soreness related to pumping. She has moved up to the #24 flanges and turned down the suction. She is wearing comfort gels. She feels like these changes have helped with the soreness. She is using expressed milk on her nipples and has coconut oil to use with pumping. She has 18ml of expressed milk at the bedside to offer after the baby comes off the breast. Her plan is to continue latching and pumping when she gets home. She stated she will latch to baby "for a little while longer then just pump to feed and then just give formula". Discussed baby behavior, feeding frequency, pumping, supplementing volumes, baby belly size, voids, wt loss, breast changes, and nipple care. Mom is aware of lactation services and support group. She will call as needed.  Mom will offer the breast on demand q3hr, post express, and supplement per volume guidelines.     Maternal Data    Feeding Feeding Type: Breast Fed Length of feed: 15 min  LATCH Score                   Interventions    Lactation Tools Discussed/Used Pump Review: Other (comment) (frequency) Initiated by:: ES Date initiated:: 04/03/17   Consult Status Consult Status: Complete Follow-up type: Call as needed    Lacey Hill 04/03/2017, 10:51 AM

## 2017-04-04 ENCOUNTER — Ambulatory Visit: Payer: Self-pay

## 2017-04-04 NOTE — Lactation Note (Signed)
This note was copied from a baby's chart. Lactation Consultation Note: assist mother with latching infant to the right breast in football hold. Mother has positional strip on the tip of the right nipple. Mother was given comfort gels. Mother pumped 20 ml of pale pink milk from the right breast and another 15 ml from the left breast. Mother was advised to wake infant well and do skin to skin . Mother to allow infant to drain breast well and then offer the alternate breast. Observed infant feeding well with rhythmic suckling and audible swallows. Mother has an electric pump at home. She was advised to continue to post pump for 15-20 mins after each feeding and supplement infant with expressed breastmilk. Mother has supplemental guideline parameters. [facility] get [facility] call for outpatient visit. Mother also has LC phone number. She was advised to continue to cue base feed and feed infant 8-12 times daily. Mother has peds appt in am for weight check. Reviewed treatment and prevention for engorgement. Mother receptive to all teaching.   Patient Name: Lacey Hill Reason for consult: Follow-up assessment   Maternal Data    Feeding Feeding Type: Breast Fed  LATCH Score Latch: Grasps breast easily, tongue down, lips flanged, rhythmical sucking.  Audible Swallowing: Spontaneous and intermittent  Type of Nipple: Everted at rest and after stimulation  Comfort (Breast/Nipple): Filling, red/small blisters or bruises, mild/mod discomfort  Hold (Positioning): Assistance needed to correctly position infant at breast and maintain latch.  LATCH Score: 8  Interventions    Lactation Tools Discussed/Used     Consult Status Consult Status: Follow-up Follow-up type: Out-patient    Lacey Hill, Lacey Hill Hill, 12:09 PM

## 2017-04-05 LAB — TYPE AND SCREEN
ABO/RH(D): AB POS
ANTIBODY SCREEN: POSITIVE
DAT, IgG: NEGATIVE
UNIT DIVISION: 0
Unit division: 0

## 2017-04-05 LAB — BPAM RBC
BLOOD PRODUCT EXPIRATION DATE: 201809232359
BLOOD PRODUCT EXPIRATION DATE: 201809232359
UNIT TYPE AND RH: 6200
Unit Type and Rh: 6200

## 2017-04-23 ENCOUNTER — Encounter: Payer: Self-pay | Admitting: Obstetrics and Gynecology

## 2017-04-24 ENCOUNTER — Telehealth: Payer: Self-pay | Admitting: *Deleted

## 2017-04-24 NOTE — Telephone Encounter (Signed)
Patient had sent E-mail regarding some post partum complications- attempted to call patient- she needs ASAP appointment. Left message to call back.

## 2017-04-25 ENCOUNTER — Encounter: Payer: Self-pay | Admitting: Obstetrics and Gynecology

## 2017-04-26 ENCOUNTER — Inpatient Hospital Stay (HOSPITAL_COMMUNITY)
Admission: AD | Admit: 2017-04-26 | Discharge: 2017-04-26 | Disposition: A | Discharge status: Home / Self Care | Payer: Medicaid Other | Source: Ambulatory Visit | Attending: Obstetrics & Gynecology | Admitting: Obstetrics & Gynecology

## 2017-04-26 DIAGNOSIS — B379 Candidiasis, unspecified: Secondary | ICD-10-CM | POA: Diagnosis not present

## 2017-04-26 DIAGNOSIS — Z87891 Personal history of nicotine dependence: Secondary | ICD-10-CM | POA: Insufficient documentation

## 2017-04-26 DIAGNOSIS — IMO0001 Reserved for inherently not codable concepts without codable children: Secondary | ICD-10-CM

## 2017-04-26 DIAGNOSIS — T814XXA Infection following a procedure, initial encounter: Secondary | ICD-10-CM | POA: Insufficient documentation

## 2017-04-26 DIAGNOSIS — Z880 Allergy status to penicillin: Secondary | ICD-10-CM | POA: Insufficient documentation

## 2017-04-26 MED ORDER — SULFAMETHOXAZOLE-TRIMETHOPRIM 800-160 MG PO TABS: 1.0000 | ORAL_TABLET | Freq: Two times a day (BID) | ORAL | 0 refills | Status: AC

## 2017-04-26 MED ORDER — FLUCONAZOLE 200 MG PO TABS: 200.0000 mg | ORAL_TABLET | Freq: Every day | ORAL | 0 refills | Status: DC

## 2017-04-26 NOTE — MAU Note (Signed)
8/27 vaginal delivery and tubal Presents with c/o yellow leaking from incision site  Also thinks she has thrush on both nipples. White and flakey. Sore when pumping.

## 2017-04-26 NOTE — MAU Provider Note (Signed)
History     CSN: [id number]  Arrival date and time: 04/26/17 1233   First Provider Initiated Contact with Patient 04/26/17 1402      Chief Complaint  Patient presents with  . Wound Check  . Lacey Hill is a 26 y.o. [id number] who had a BTL on 04/01/17. She reports that for about one week she has had some yellow drainage coming from one of her incisions. She denies any pain. She also she thinks that she has nipple yeast. She reports that she has had breast pain and white, flaky skin on nipples. She is also having shooting breast pains, worse after pumping.    Wound Check  She was originally treated more than 14 days ago. Prior ED Treatment: laproscopic BTL  Her temperature was unmeasured prior to arrival. There has been colored discharge from the wound. There is no redness present. There is no swelling present. There is no pain present.    Past Medical History:  Diagnosis Date  . Asthma   . Gestational diabetes   . Herpes   . History of gestational diabetes mellitus (GDM) in prior pregnancy, currently pregnant 12/20/2016   Normal 2 hr GTT this pregnancy    Past Surgical History:  Procedure Laterality Date  . TONSILLECTOMY  1995  . TUBAL LIGATION N/A 04/01/2017   Procedure: POST PARTUM TUBAL LIGATION;  Surgeon: Adam Phenix, MD;  Location: [facility] BIRTHING SUITES;  Service: Gynecology;  Laterality: N/A;  . WISDOM TOOTH EXTRACTION  2017    Family History  Problem Relation Age of Onset  . Heart attack Mother   . Diabetes Mother   . Hypertension Mother   . Fibromyalgia Mother   . Cervical cancer Mother   . Colon cancer Paternal Grandfather     Social History  Substance Use Topics  . Smoking status: Former Smoker    Types: Cigarettes    Quit date: 2016  . Smokeless tobacco: Never Used  . Alcohol use No    Allergies:  Allergies  Allergen Reactions  . Penicillins     Unknown from childhood.    Prescriptions Prior to Admission  Medication Sig Dispense Refill  Last Dose  . acetaminophen (TYLENOL) 500 MG tablet Take 500 mg by mouth every 6 (six) hours as needed for moderate pain or headache.   03/29/2017  . albuterol (PROVENTIL HFA;VENTOLIN HFA) 108 (90 Base) MCG/ACT inhaler Inhale 1 puff into the lungs every 6 (six) hours as needed for wheezing or shortness of breath. 1 Inhaler 6 emergency  . famotidine (PEPCID) 20 MG tablet Take 1 tablet (20 mg total) by mouth 2 (two) times daily. 60 tablet 3 03/31/2017 at Unknown time  . ibuprofen (ADVIL,MOTRIN) 600 MG tablet Take 1 tablet (600 mg total) by mouth every 6 (six) hours. 30 tablet 0   . Prenatal Vit-Fe Fumarate-FA (PRENATAL MULTIVITAMIN) TABS tablet Take 1 tablet by mouth daily at 12 noon. 30 tablet 0   . valACYclovir (VALTREX) 500 MG tablet Take two tablets by mouth twice daily for ten days; then one tablet twice daily for remainder of pregnancy 180 tablet 5 03/31/2017 at Unknown time    Review of Systems  Constitutional: Negative for chills and fever.  Gastrointestinal: Negative for nausea and vomiting.  Genitourinary: Negative for vaginal bleeding and vaginal discharge.   Physical Exam   Blood pressure 112/78, pulse (!) 57, temperature 97.8 F (36.6 C), temperature source Oral, resp. rate 16, weight 122 lb 1.3 oz (55.4 kg), SpO2 100 %,  unknown if currently breastfeeding.  Physical Exam  Nursing note and vitals reviewed. Constitutional: She is oriented to person, place, and time. She appears well-developed and well-nourished. No distress.  HENT:  Head: Normocephalic.  Cardiovascular: Normal rate.   Respiratory: Effort normal. Right breast exhibits nipple discharge (nipples are slightly reddened with some white discharge noted at the base of the nipple. ). Right breast exhibits no inverted nipple, no mass, no skin change and no tenderness. Left breast exhibits nipple discharge. Left breast exhibits no inverted nipple, no mass, no skin change and no tenderness.  GI: Soft. There is no tenderness. There  is no rebound.  Small amount of tan drainage noted at the umbilical incision.   Neurological: She is alert and oriented to person, place, and time.  Skin: Skin is warm and dry.  Psychiatric: She has a normal mood and affect.    MAU Course  Procedures  MDM   Assessment and Plan   1. Postoperative wound infection, initial encounter   2. Candidiasis    DC home Comfort measures reviewed  RX: diflucan  POx 1, then  QD. Bactrim BID #14  Return to MAU as needed FU with OB as planned  Follow-up Information    [facility].   Specialty:  Obstetrics and Gynecology Contact information: [address] [place] [address] [phone]          Thressa Sheller 04/26/2017, 2:03 PM

## 2017-04-26 NOTE — Discharge Instructions (Signed)
In late 2019, the [facility] will be moving to the [place] campus. At that time, the MAU will no longer serve non-pregnant patients. We encourage you to establish care with a provider before that time, so that you can be seen with any GYN concerns, like vaginal discharge, urinary tract infection, etc.. in a timely manner. In order to make the office visit more convenient, the [facility] will be offering evening hours from 4pm-8pm on Mondays starting 04/15/17. There will be same-day appointments, walk-in appointments and scheduled appointments available during this time.   ° °[facility] - [phone] ° °For urgent needs, [facility] is also available for management of urgent GYN complaints such as vaginal discharge.  ° °[employer] extends eligibility for family planning services to reduce unintended pregnancies and improve the well-being of children and families. ° ° Eligible individuals whose income is at or below 195% of the federal poverty level and who are:  °- [place] citizens, documented immigrants or qualified aliens;  °- Residents of [place];  °- Not incarcerated; and  °- Not pregnant.  ° °[employer] Family Planning Contact Information:  °Medical Assistance Clinical Section °Phone: 919-855-4260 °Email: dma.besmart@[email or web address]  ° ° ° °

## 2017-04-30 ENCOUNTER — Other Ambulatory Visit: Payer: Self-pay | Admitting: Obstetrics and Gynecology

## 2017-04-30 MED ORDER — FLUCONAZOLE 150 MG PO TABS: 150.0000 mg | ORAL_TABLET | Freq: Once | ORAL | 0 refills | Status: AC

## 2017-05-30 ENCOUNTER — Encounter: Payer: Self-pay | Admitting: Obstetrics and Gynecology

## 2017-05-30 ENCOUNTER — Ambulatory Visit (INDEPENDENT_AMBULATORY_CARE_PROVIDER_SITE_OTHER): Payer: Medicaid Other | Admitting: Obstetrics and Gynecology

## 2017-05-30 ENCOUNTER — Other Ambulatory Visit (HOSPITAL_COMMUNITY): Payer: Self-pay | Admitting: Family Medicine

## 2017-05-30 DIAGNOSIS — O0990 Supervision of high risk pregnancy, unspecified, unspecified trimester: Secondary | ICD-10-CM

## 2017-05-30 MED ORDER — PRENATAL MULTIVITAMIN CH: 1.0000 | ORAL_TABLET | Freq: Every day | ORAL | 0 refills | Status: DC

## 2017-05-30 MED ORDER — FLUCONAZOLE 200 MG PO TABS: 200.0000 mg | ORAL_TABLET | Freq: Every day | ORAL | 0 refills | Status: AC

## 2017-05-30 MED ORDER — FLUCONAZOLE 200 MG PO TABS: 200.0000 mg | ORAL_TABLET | Freq: Every day | ORAL | 0 refills | Status: DC

## 2017-05-30 MED ORDER — PRENATAL MULTIVITAMIN CH: 1.0000 | ORAL_TABLET | Freq: Every day | ORAL | 0 refills | Status: AC

## 2017-05-30 NOTE — Addendum Note (Signed)
Addended by: Catalina AntiguaONSTANT, Jovany Disano on: 05/30/2017 10:58 AM   Modules accepted: Orders

## 2017-05-30 NOTE — Progress Notes (Signed)
Subjective:     Lacey Hill is a 26 y.o. female who presents for a postpartum visit. She is 8 weeks postpartum following a spontaneous vaginal delivery. I have fully reviewed the prenatal and intrapartum course. The delivery was at 35 gestational weeks due to SROM. Outcome: spontaneous vaginal delivery. Anesthesia: epidural. Postpartum course has been uncomplicated. Baby's course has been GOOD. Baby is feeding by breast. Bleeding no bleeding. Bowel function is normal. Bladder function is normal. Patient is not sexually active. Contraception method is tubal ligation. Postpartum depression screening: negative. Patient reports being diagnosed with yeast infection on breast last month, treated with good results. She reports that the symptoms have returned. She describes nipple soreness and glass-like shouting pain in her breasts. She denies any issues with breastfeeding  Review of Systems Pertinent items are noted in HPI.   Objective:    BP 117/66   Pulse 92   Ht 5' (1.524 m)   Wt 122 lb (55.3 kg)   Breastfeeding? Yes   BMI 23.83 kg/m   General:  alert, cooperative and no distress   Breasts:  inspection negative, no nipple discharge or bleeding, no masses or nodularity palpable  Lungs: clear to auscultation bilaterally  Heart:  regular rate and rhythm  Abdomen: soft, non-tender; bowel sounds normal; no masses,  no organomegaly, Umbilical incision: healed completely   Vulva:  normal  Vagina: normal vagina, no discharge, exudate, lesion, or erythema  Cervix:  multiparous appearance  Corpus: normal size, contour, position, consistency, mobility, non-tender  Adnexa:  normal adnexa and no mass, fullness, tenderness  Rectal Exam: Not performed.        Assessment:     Normal postpartum exam. Pap smear not done at today's visit.   Plan:    1. Contraception: tubal ligation 2. Patient is medically cleared to resume all activities of daily living. Rx diflucan provided 3. Follow up in: 8 months  for annual exam or as needed.

## 2017-09-17 ENCOUNTER — Other Ambulatory Visit: Payer: Self-pay | Admitting: Family Medicine

## 2017-12-17 ENCOUNTER — Emergency Department (HOSPITAL_BASED_OUTPATIENT_CLINIC_OR_DEPARTMENT_OTHER)
Admission: EM | Admit: 2017-12-17 | Discharge: 2017-12-18 | Disposition: A | Discharge status: Home / Self Care | Payer: Medicaid Other | Attending: Emergency Medicine | Admitting: Emergency Medicine

## 2017-12-17 ENCOUNTER — Other Ambulatory Visit: Payer: Self-pay

## 2017-12-17 ENCOUNTER — Emergency Department (HOSPITAL_BASED_OUTPATIENT_CLINIC_OR_DEPARTMENT_OTHER): Payer: Medicaid Other

## 2017-12-17 ENCOUNTER — Encounter (HOSPITAL_BASED_OUTPATIENT_CLINIC_OR_DEPARTMENT_OTHER): Payer: Self-pay

## 2017-12-17 DIAGNOSIS — R600 Localized edema: Secondary | ICD-10-CM | POA: Insufficient documentation

## 2017-12-17 DIAGNOSIS — N61 Mastitis without abscess: Secondary | ICD-10-CM | POA: Insufficient documentation

## 2017-12-17 DIAGNOSIS — N644 Mastodynia: Secondary | ICD-10-CM | POA: Diagnosis present

## 2017-12-17 DIAGNOSIS — Z79899 Other long term (current) drug therapy: Secondary | ICD-10-CM | POA: Diagnosis not present

## 2017-12-17 DIAGNOSIS — J45909 Unspecified asthma, uncomplicated: Secondary | ICD-10-CM | POA: Insufficient documentation

## 2017-12-17 DIAGNOSIS — Z87891 Personal history of nicotine dependence: Secondary | ICD-10-CM | POA: Diagnosis not present

## 2017-12-17 LAB — COMPREHENSIVE METABOLIC PANEL
ALBUMIN: 4.2 g/dL (ref 3.5–5.0)
ALK PHOS: 64 U/L (ref 38–126)
ALT: 15 U/L (ref 14–54)
AST: 20 U/L (ref 15–41)
Anion gap: 8 (ref 5–15)
BILIRUBIN TOTAL: 0.3 mg/dL (ref 0.3–1.2)
BUN: 17 mg/dL (ref 6–20)
CALCIUM: 8.9 mg/dL (ref 8.9–10.3)
CO2: 23 mmol/L (ref 22–32)
Chloride: 101 mmol/L (ref 101–111)
Creatinine, Ser: 0.67 mg/dL (ref 0.44–1.00)
GFR calc Af Amer: >60 mL/min (ref >60)
GLUCOSE: 101 mg/dL — AB (ref 65–99)
Potassium: 3.7 mmol/L (ref 3.5–5.1)
Sodium: 132 mmol/L — ABNORMAL LOW (ref 135–145)
TOTAL PROTEIN: 7.6 g/dL (ref 6.5–8.1)

## 2017-12-17 LAB — URINALYSIS, ROUTINE W REFLEX MICROSCOPIC
Bilirubin Urine: NEGATIVE
Glucose, UA: NEGATIVE mg/dL
Hgb urine dipstick: NEGATIVE
Ketones, ur: NEGATIVE mg/dL
LEUKOCYTES UA: NEGATIVE
NITRITE: NEGATIVE
PROTEIN: NEGATIVE mg/dL
Specific Gravity, Urine: 1.02 (ref 1.005–1.030)
pH: 7.5 (ref 5.0–8.0)

## 2017-12-17 LAB — CBC WITH DIFFERENTIAL/PLATELET
BASOS PCT: 0 %
Basophils Absolute: 0 10*3/uL (ref 0.0–0.1)
Eosinophils Absolute: 0.3 10*3/uL (ref 0.0–0.7)
Eosinophils Relative: 3 %
HEMATOCRIT: 36.6 % (ref 36.0–46.0)
HEMOGLOBIN: 12.8 g/dL (ref 12.0–15.0)
Lymphocytes Relative: 9 %
Lymphs Abs: 1 10*3/uL (ref 0.7–4.0)
MCH: 32.1 pg (ref 26.0–34.0)
MCHC: 35 g/dL (ref 30.0–36.0)
MCV: 91.7 fL (ref 78.0–100.0)
MONOS PCT: 5 %
Monocytes Absolute: 0.6 10*3/uL (ref 0.1–1.0)
NEUTROS PCT: 83 %
Neutro Abs: 9.2 10*3/uL — ABNORMAL HIGH (ref 1.7–7.7)
Platelets: 205 10*3/uL (ref 150–400)
RBC: 3.99 MIL/uL (ref 3.87–5.11)
RDW: 12.6 % (ref 11.5–15.5)
WBC: 11.1 10*3/uL — ABNORMAL HIGH (ref 4.0–10.5)

## 2017-12-17 LAB — PROTIME-INR
INR: 1
Prothrombin Time: 13.1 seconds (ref 11.4–15.2)

## 2017-12-17 LAB — PREGNANCY, URINE: PREG TEST UR: NEGATIVE

## 2017-12-17 LAB — I-STAT CG4 LACTIC ACID, ED: LACTIC ACID, VENOUS: 1.58 mmol/L (ref 0.5–1.9)

## 2017-12-17 MED ORDER — VANCOMYCIN HCL IN DEXTROSE 1-5 GM/200ML-% IV SOLN
1000.0000 mg | Freq: Once | INTRAVENOUS | Status: AC
  Administered 2017-12-17: 1000 mg via INTRAVENOUS
  Filled 2017-12-17: qty 200

## 2017-12-17 MED ORDER — SODIUM CHLORIDE 0.9 % IV BOLUS
1000.0000 mL | Freq: Once | INTRAVENOUS | Status: AC
  Administered 2017-12-17: 1000 mL via INTRAVENOUS

## 2017-12-17 MED ORDER — KETOROLAC TROMETHAMINE 30 MG/ML IJ SOLN
30.0000 mg | Freq: Once | INTRAMUSCULAR | Status: DC
  Filled 2017-12-17: qty 1

## 2017-12-17 MED ORDER — CLINDAMYCIN HCL 300 MG PO CAPS: 300.0000 mg | ORAL_CAPSULE | Freq: Four times a day (QID) | ORAL | 0 refills | Status: AC

## 2017-12-17 MED ORDER — ACETAMINOPHEN 500 MG PO TABS
1000.0000 mg | ORAL_TABLET | Freq: Once | ORAL | Status: AC
  Administered 2017-12-17: 1000 mg via ORAL
  Filled 2017-12-17: qty 2

## 2017-12-17 NOTE — ED Notes (Signed)
Pt returned from xray and is using her breast pump

## 2017-12-17 NOTE — ED Notes (Signed)
Hold toradol, pt wants to pump first
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
## 2017-12-17 NOTE — ED Triage Notes (Signed)
Pt c/o pain to left breast and difficult to pump due to pain-breastfeeding x 8 mos-NAD-steady gait

## 2017-12-18 NOTE — ED Notes (Signed)
Pt verbalizes understanding of d/c instructions and denies any further needs at this time. 

## 2017-12-23 LAB — CULTURE, BLOOD (ROUTINE X 2)
Culture: NO GROWTH
Culture: NO GROWTH
Special Requests: ADEQUATE
Special Requests: ADEQUATE

## 2018-04-24 IMAGING — US US MFM OB DETAIL+14 WK
1 series · 13 of 28 positions shown · non-contrast
Comparison: none

Road [HOSPITAL]

Indications
22 weeks gestation of pregnancy
Encounter for antenatal screening for
malformations
Poor obstetric history: Previous preterm
deliveryx7(25.35)17P
Poor obstetric history: Previous gestational
diabetes
OB History
Blood Type:            Height:  5'     Weight (lb):  120      BMI:
Gravidity:    4         Term:   1        Prem:   2        SAB:   0
TOP:          0       Ectopic:  0        Living: 3
Fetal Evaluation
Num Of Fetuses:     1
Fetal Heart         143
Rate(bpm):
Cardiac Activity:   Observed
Presentation:       Variable
Placenta:           Anterior, above cervical os
P. Cord Insertion:  Visualized
Amniotic Fluid
AFI FV:      Subjectively within normal limits
Largest Pocket(cm)
3.91
Biometry
BPD:        53  mm     G. Age:  22w 1d         45  %    CI:        72.68   %   70 - 86
FL/HC:      21.3   %   18.4 -
HC:      197.7  mm     G. Age:  22w 0d         31  %    HC/AC:      1.09       1.06 -
AC:      181.9  mm     G. Age:  23w 0d         70  %    FL/BPD:     79.4   %   71 - 87
FL:       42.1  mm     G. Age:  23w 5d         86  %    FL/AC:      23.1   %   20 - 24
HUM:      38.3  mm     G. Age:  23w 4d         82  %
CER:      25.3  mm     G. Age:  23w 2d         67  %
CM:        5.8  mm
Est. FW:     566  gm      1 lb 4 oz     63  %
Gestational Age
LMP:           22w 1d       Date:   07/24/16                 EDD:   04/30/17
U/S Today:     22w 5d                                        EDD:   04/26/17
Best:          22w 1d    Det. By:   LMP  (07/24/16)          EDD:   04/30/17
Anatomy
Cranium:               Appears normal         Aortic Arch:            Appears normal
Cavum:                 Appears normal         Ductal Arch:            Appears normal
Ventricles:            Appears normal         Diaphragm:              Not well visualized
Choroid Plexus:        Appears normal         Stomach:                Appears normal, left
sided
Cerebellum:            Appears normal         Abdomen:                Appears normal
Posterior Fossa:       Appears normal         Abdominal Wall:         Appears nml (cord
insert, abd wall)
Nuchal Fold:           Appears normal         Cord Vessels:           Appears normal (3
vessel cord)
Face:                  Orbits nl; profile not Kidneys:                Appear normal
well visualized
Lips:                  Not well visualized    Bladder:                Appears normal
Thoracic:              Appears normal         Spine:                  Limited views
appear normal
Heart:                 Appears normal         Upper Extremities:      Appears normal;
(4CH)-EIF in LV                                hands nws
RVOT:                  Appears normal         Lower Extremities:      Appears normal
LVOT:                  Appears normal
Other:  Parents do not wish to know sex of fetus at this time. Heels visualized
and rt 5th visualized.
Cervix Uterus Adnexa
Cervix
Length:              4  cm.
Normal appearance by transabdominal scan.
Uterus
No abnormality visualized.
Left Ovary
Size(cm)       2.5 x    1.4    x  2.1       Vol(ml):
Within normal limits.
Right Ovary
Size(cm)       1.8 x    1.1    x  1.3       Vol(ml):
Impression
INDICATION: 25 yr old [id number] at 44w9d with history of
preterm delivery for fetal anatomic survey and cervical length.
Remote read.

[Series 1: us mfm ob detail+14 wk · 13 of 85 slices shown]
[im 4/85]
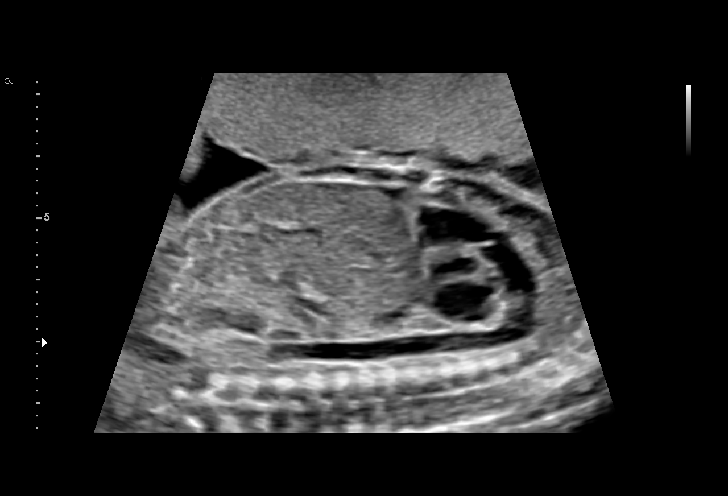
[im 10/85]
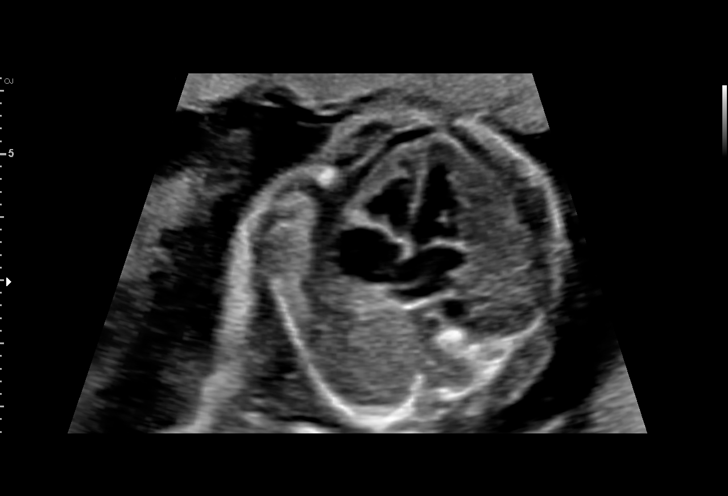
[im 16/85]
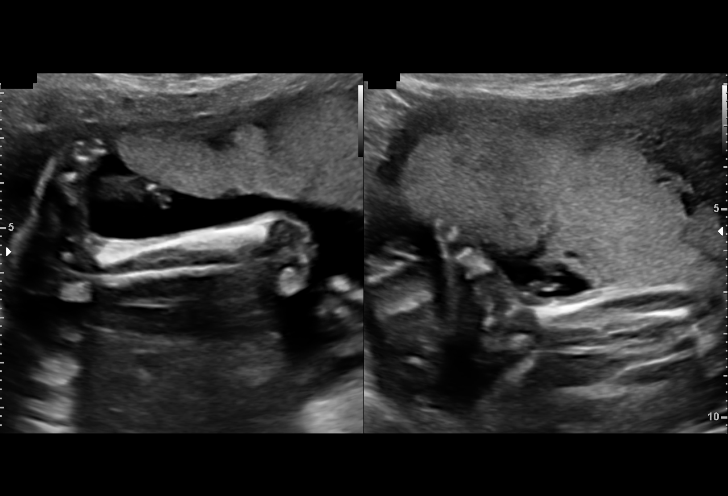
[im 22/85]
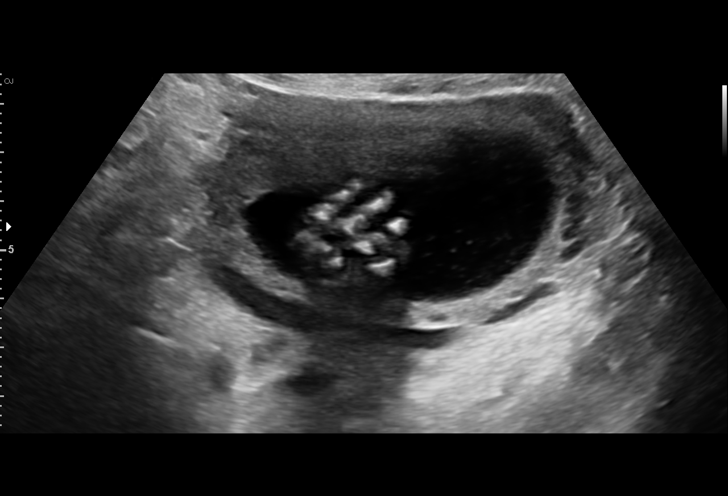
[im 29/85]
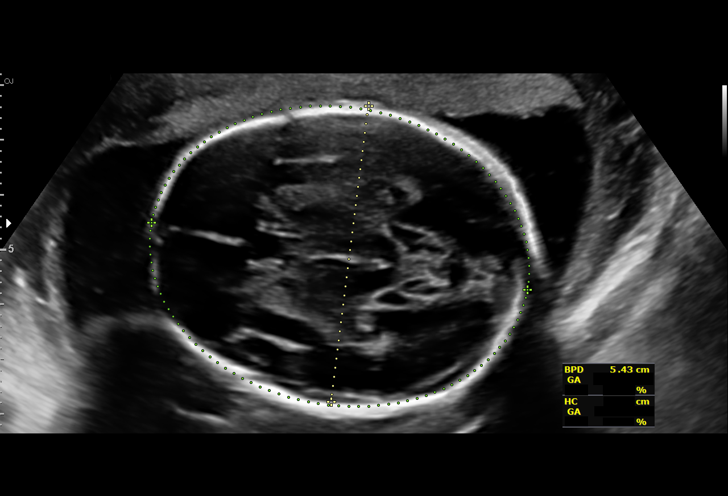
[im 35/85]
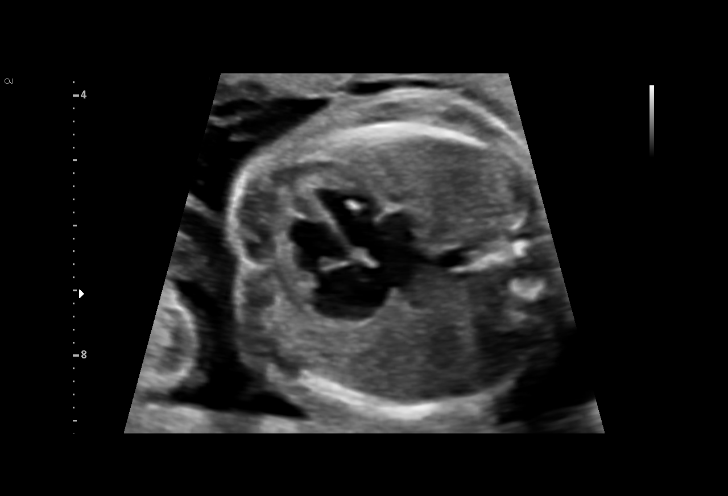
[im 44/85]
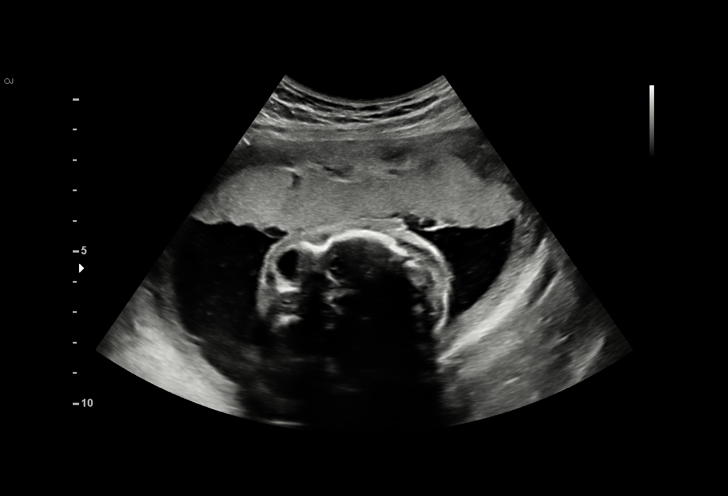
[im 50/85]
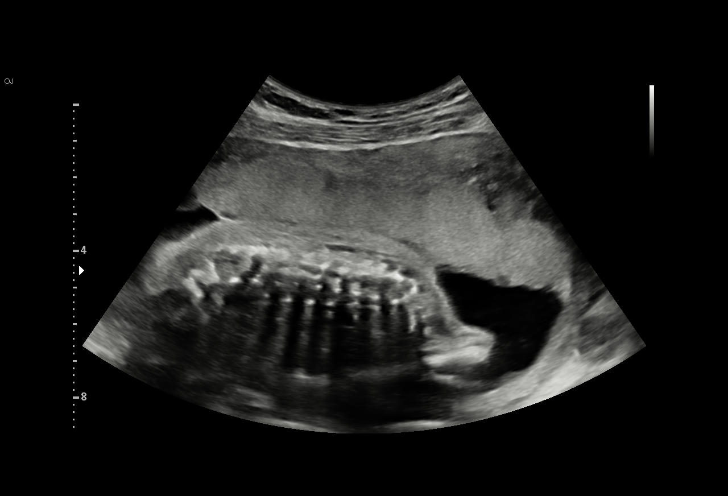
[im 57/85]
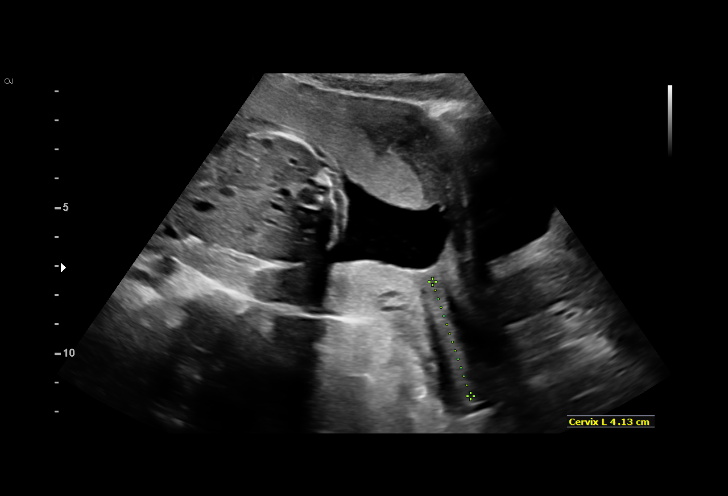
[im 63/85]
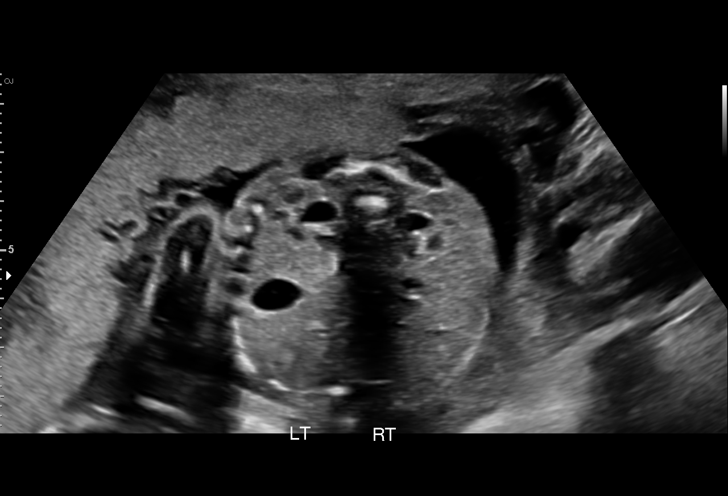
[im 69/85]
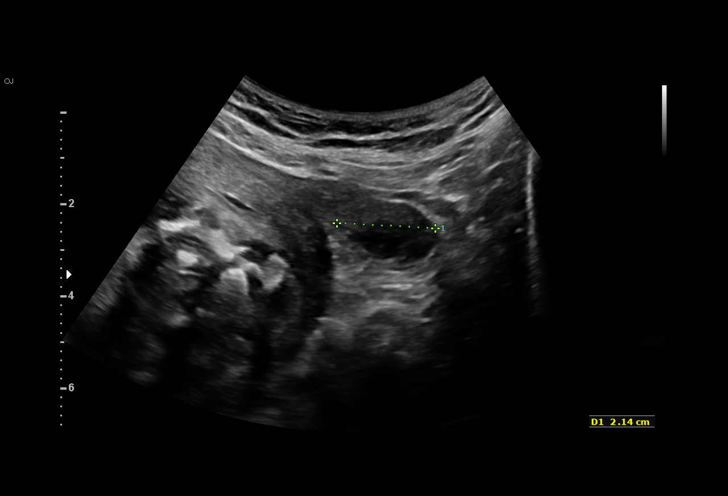
[im 75/85]
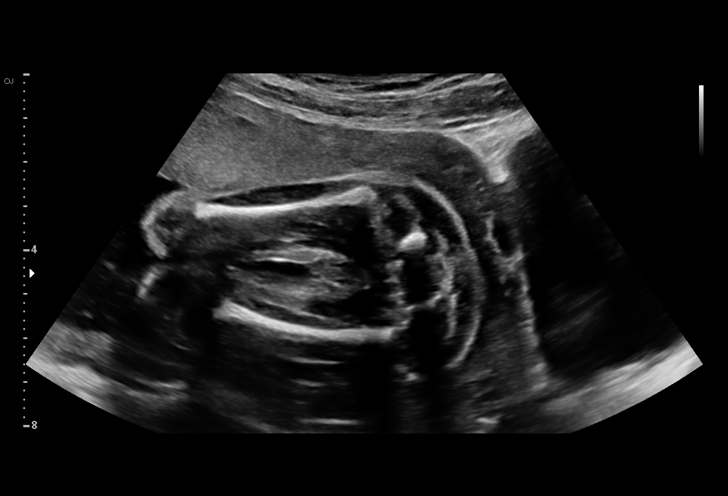
[im 81/85]
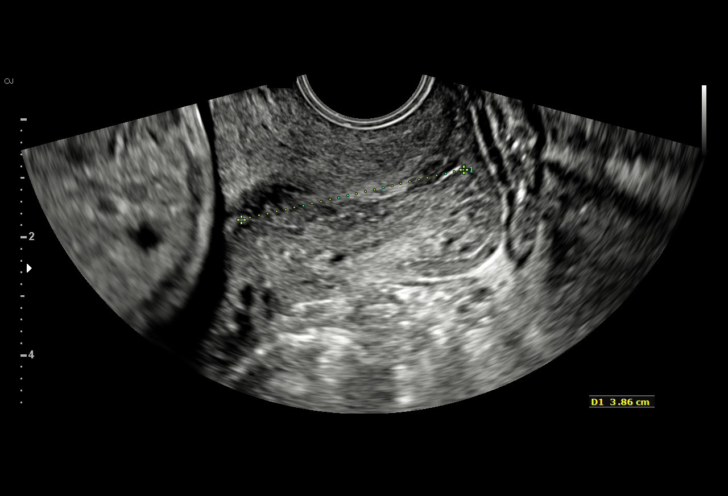

[13 of 28 positions shown; findings below may reference images not displayed]

FINDINGS: 1. Single intrauterine pregnancy.
2. Estimated fetal weight is in the 63rd%.
3. Anterior placenta without evidence of previa.
4. Normal amniotic fluid volume.
5. Normal transvaginal cervical length.
6. The views of the profile, nasal bone, lips, diaphragm, and
hands are limited.
7. There is an echogenic focus in the left ventricle.
8. The remainder of the limited anatomy is normal.
Recommendations

1. Appropriate fetal growth.
2. Limited anatomy survey:
- recommend follow up in 2 weeks to reevaluate fetal anatomy
3. History of preterm deliveries:
- not counseled as this is a remote read
- patient on [place]
- recommend serial cervical lengths at least every 2 weeks
until 26-28 weeks
- normal cervical lenght on today's exam
4. Echogenic focus in the left ventricle:
- not counseled as this is a remote read
- recommend offer aneuploidy screening
5. Normal MSAFP

Of note patient's chart reports she has red cell antibodies but
could not be identified- recommend consider sending to
[facility] to identify

## 2018-05-30 IMAGING — US US MFM OB FOLLOW-UP
1 series · 14 of 28 positions shown · non-contrast
Comparison: none

[Series 1: us mfm ob follow-up · 14 of 70 slices shown]
[im 3/70]
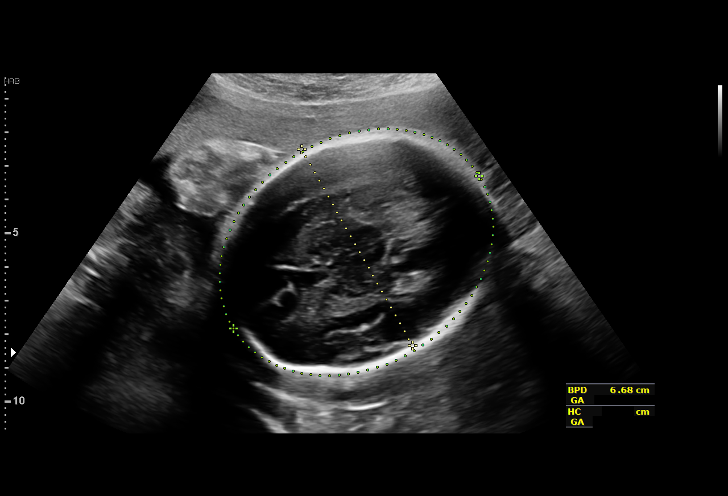
[im 8/70]
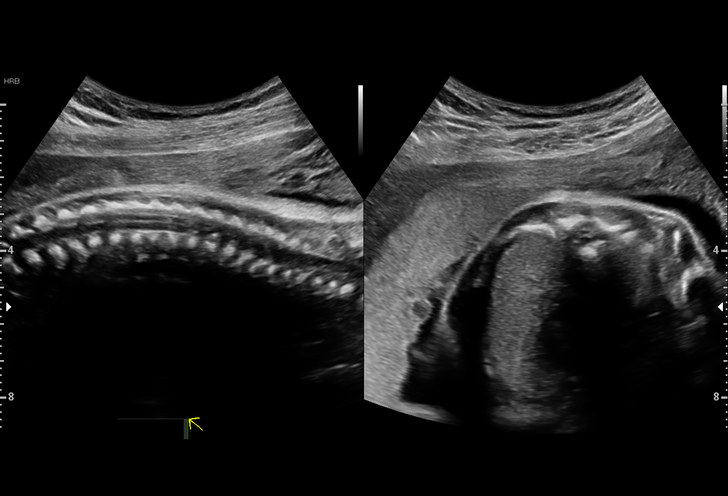
[im 13/70]
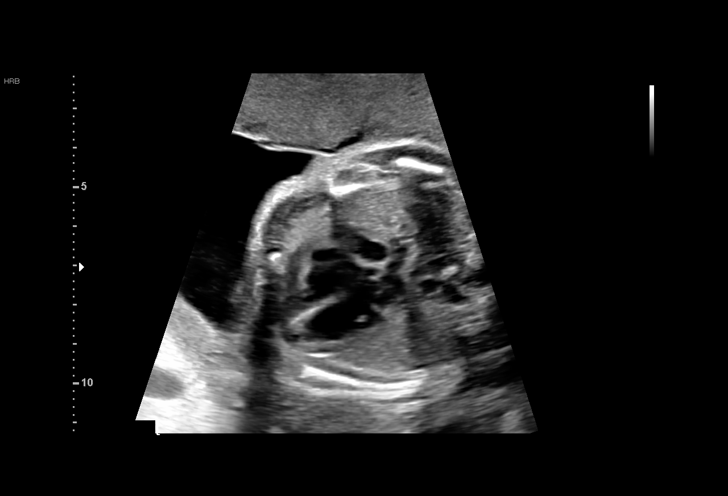
[im 18/70]
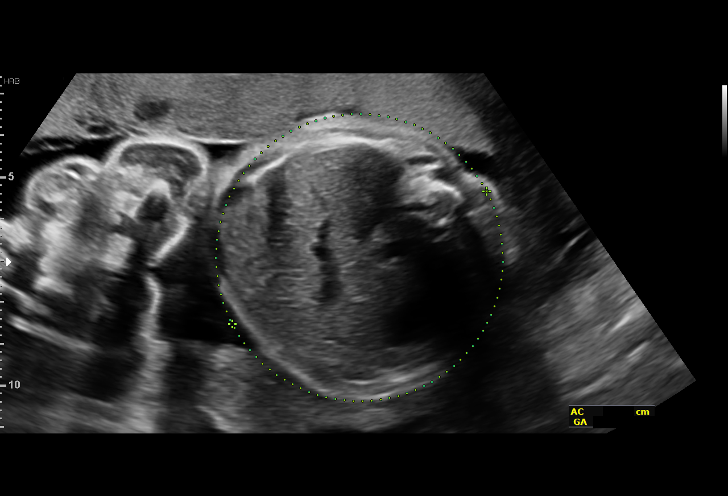
[im 24/70]
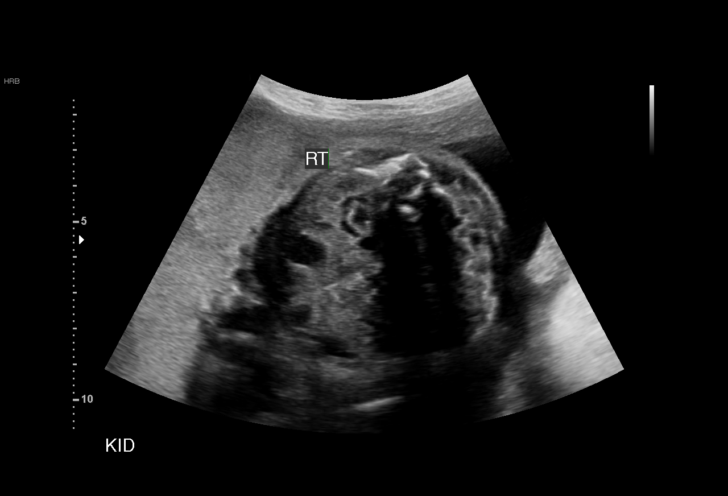
[im 29/70]
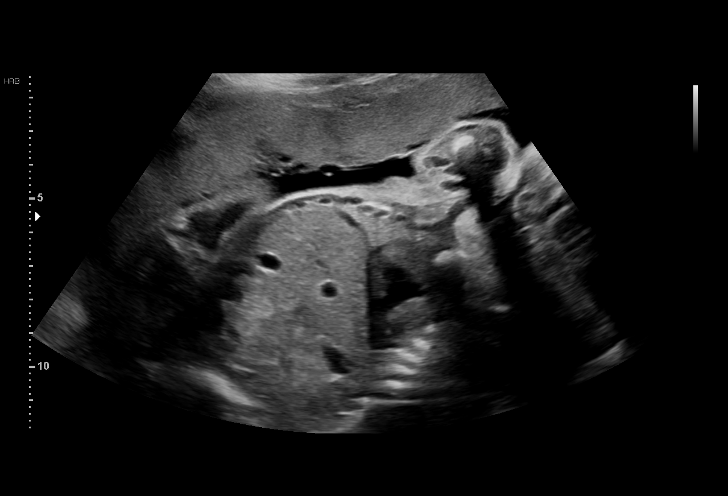
[im 34/70]
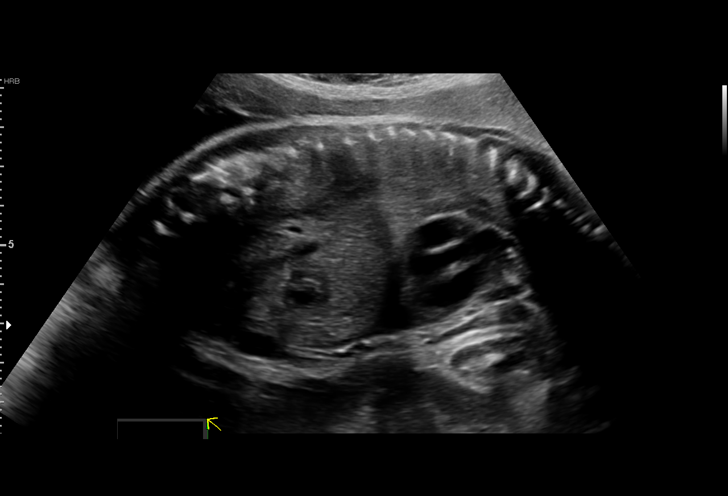
[im 39/70]
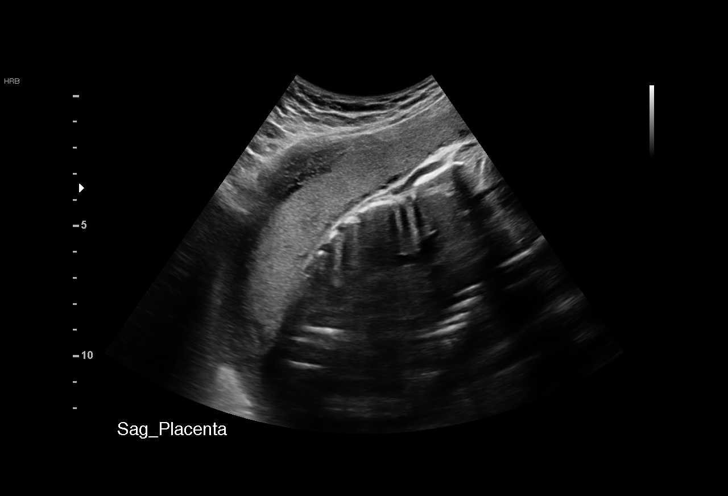
[im 44/70]
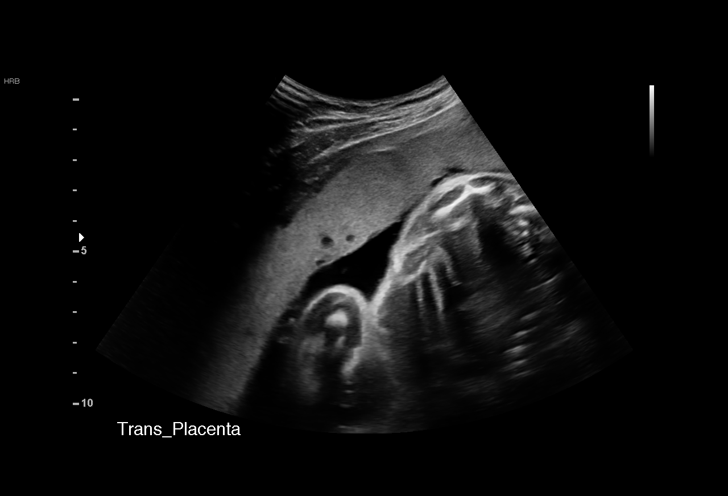
[im 49/70]
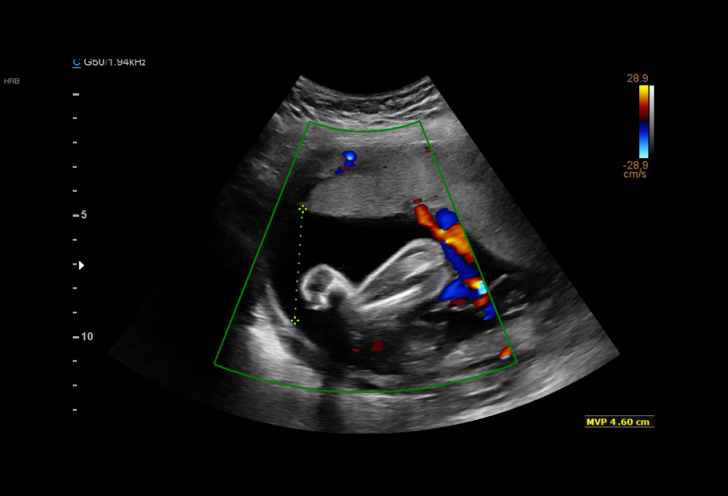
[im 54/70]
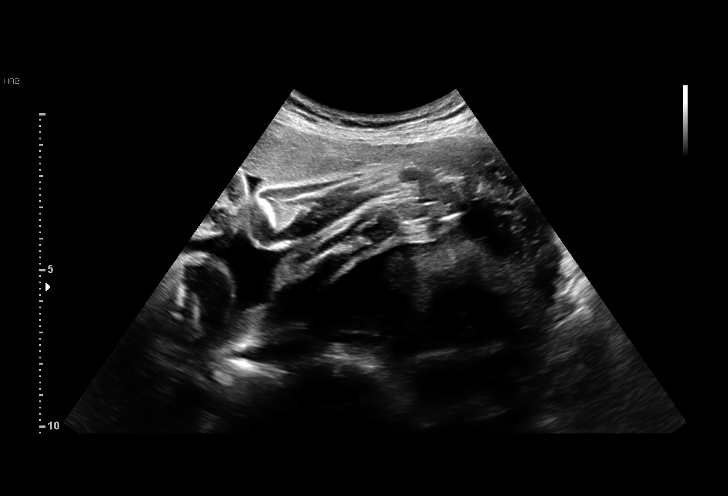
[im 59/70]
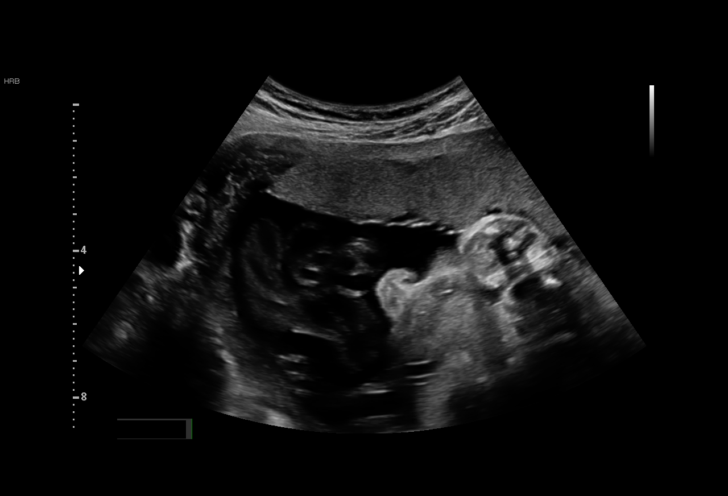
[im 64/70]
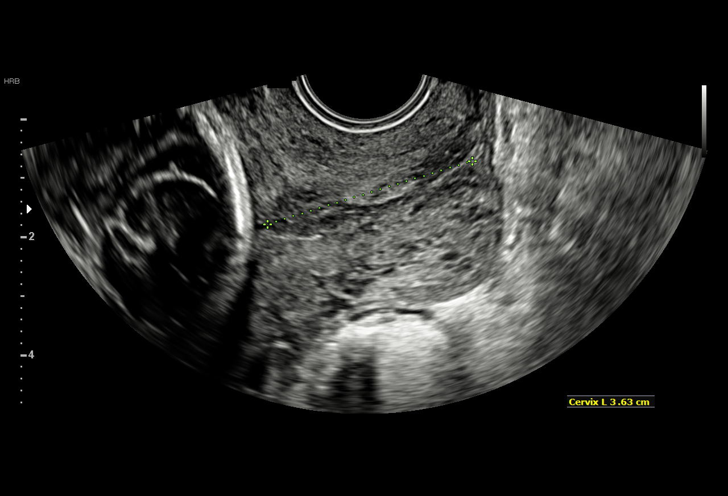
[im 70/70]
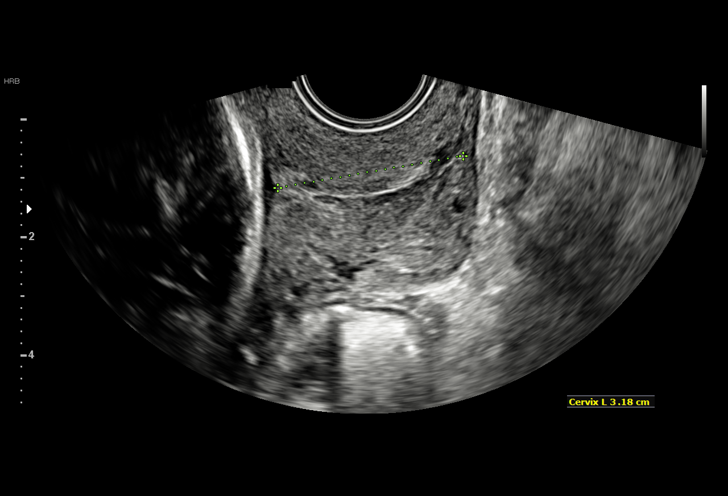

[14 of 28 positions shown; findings below may reference images not displayed]

Road [HOSPITAL]

1  HONZKA MEDEK           [id number]      [id number]     [id number]
2  HONZKA MEDEK           [id number]      [id number]     [id number]
Indications

27 weeks gestation of pregnancy
Poor obstetric history: Previous preterm
deliveryx4(25.35)17P
Poor obstetric history: Previous gestational
diabetes
OB History

Blood Type:            Height:  5'     Weight (lb):  120       BMI:
Gravidity:    4         Term:   1        Prem:   2        SAB:   0
TOP:          0       Ectopic:  0        Living: 3
Fetal Evaluation

Num Of Fetuses:     1
Fetal Heart         143
Rate(bpm):
Cardiac Activity:   Observed
Presentation:       Cephalic
Placenta:           Anterior, above cervical os

Amniotic Fluid
AFI FV:      Subjectively within normal limits

Largest Pocket(cm)
4.6
Biometry
BPD:      66.6  mm     G. Age:  26w 6d         24  %    CI:        73.72   %    70 - 86
FL/HC:      22.0   %    18.6 -
HC:      246.4  mm     G. Age:  26w 5d         10  %    HC/AC:      1.14        1.05 -
AC:      216.2  mm     G. Age:  26w 1d         13  %    FL/BPD:     81.2   %    71 - 87
FL:       54.1  mm     G. Age:  28w 4d         75  %    FL/AC:      25.0   %    20 - 24
HUM:        46  mm     G. Age:  27w 1d         44  %

Est. FW:    4662  gm      2 lb 4 oz     49  %
Gestational Age

LMP:           27w 2d        Date:  07/24/16                 EDD:   04/30/17
U/S Today:     27w 1d                                        EDD:   05/01/17
Best:          27w 2d     Det. By:  LMP  (07/24/16)          EDD:   04/30/17
Anatomy

Cranium:               Appears normal         Aortic Arch:            Previously seen
Cavum:                 Appears normal         Ductal Arch:            Previously seen
Ventricles:            Appears normal         Diaphragm:              Appears normal
Choroid Plexus:        Previously seen        Stomach:                Appears normal, left
sided
Cerebellum:            Appears normal         Abdomen:                Previously seen
Posterior Fossa:       Appears normal         Abdominal Wall:         Previously seen
Nuchal Fold:           Previously seen        Cord Vessels:           Previously seen
Face:                  Orbits previously      Kidneys:                Appear normal
seen; profile nws
Lips:                  Appears normal         Bladder:                Appears normal
Thoracic:              Appears normal         Spine:                  Appears normal
Heart:                 Appears normal         Upper Extremities:      Previously seen
(4CH)-EIF in LV
RVOT:                  Previously seen        Lower Extremities:      Previously seen
LVOT:                  Previously seen

Other:  Parents do not wish to know sex of fetus at this time. Heels previously
visualized and rt 5th previously visualized.
Cervix Uterus Adnexa

Cervix
Length:           3.36  cm.
Normal appearance by transvaginal scan

Uterus
No abnormality visualized.
Impression

Singleton intrauterine pregnancy at 27 weeks 2 days
gestation with fetal cardiac activity
Cephalic presentation
Anterior placenta without evidence of previa
Normal appearing fetal growth and amniotic fluid volume
Cervical length of 
 3.36 cm without funneling
Recommendations

Follow-up ultrasounds as clinically indicated.

## 2018-06-27 IMAGING — US US MFM OB FOLLOW-UP
1 series · 13 of 28 positions shown · non-contrast
Comparison: none

Road [HOSPITAL]

Indications
31 weeks gestation of pregnancy
Poor obstetric history: Previous preterm
deliveryx0(25.35)17P
Poor obstetric history: Previous gestational
diabetes
Uterine size-date discrepancy, third trimester
(small for dates)
OB History
Blood Type:            Height:  5'     Weight (lb):  120       BMI:
Gravidity:    4         Term:   1        Prem:   2        SAB:   0
TOP:          0       Ectopic:  0        Living: 3
Fetal Evaluation
Num Of Fetuses:     1
Fetal Heart         136
Rate(bpm):
Cardiac Activity:   Observed
Presentation:       Cephalic
Placenta:           Anterior, above cervical os
P. Cord Insertion:  Previously Visualized
Amniotic Fluid
AFI FV:      Subjectively within normal limits
AFI Sum(cm)     %Tile       Largest Pocket(cm)
13.45           42
RUQ(cm)       RLQ(cm)       LUQ(cm)        LLQ(cm)
4.41
Biometry
BPD:      74.6  mm     G. Age:  30w 0d          8  %    CI:        75.72   %    70 - 86
FL/HC:      22.1   %    19.3 -
HC:      271.8  mm     G. Age:  29w 5d        < 3  %    HC/AC:      1.00        0.96 -
AC:      271.5  mm     G. Age:  31w 2d         46  %    FL/BPD:     80.6   %    71 - 87
FL:       60.1  mm     G. Age:  31w 2d         36  %    FL/AC:      22.1   %    20 - 24
HUM:      51.7  mm     G. Age:  30w 1d         29  %
Est. FW:    3969  gm    3 lb 11 oz      49  %
Gestational Age
LMP:           31w 2d        Date:  07/24/16                 EDD:   04/30/17
U/S Today:     30w 4d                                        EDD:   05/05/17
Best:          31w 2d     Det. By:  LMP  (07/24/16)          EDD:   04/30/17
Anatomy
Cranium:               Appears normal         Aortic Arch:            Previously seen
Cavum:                 Previously seen        Ductal Arch:            Previously seen
Ventricles:            Previously seen        Diaphragm:              Previously seen
Choroid Plexus:        Previously seen        Stomach:                Appears normal, left
sided
Cerebellum:            Previously seen        Abdomen:                Previously seen
Posterior Fossa:       Previously seen        Abdominal Wall:         Previously seen
Nuchal Fold:           Previously seen        Cord Vessels:           Previously seen
Face:                  Orbits previously      Kidneys:                Appear normal
seen; profile nl
Lips:                  Previously seen        Bladder:                Appears normal
Thoracic:              Appears normal         Spine:                  Previously seen
Heart:                 Appears normal         Upper Extremities:      Previously seen
(4CH)-EIF in LV
RVOT:                  Previously seen        Lower Extremities:      Previously seen
LVOT:                  Appears normal
Other:  Fetus appears to be a male. Heels previously visualized and rt 5th
previously visualized.
Cervix Uterus Adnexa
Cervix
Not visualized (advanced GA >86wks)
Impression
INDICATION: 26 yr old [id number] at 61w6d with history of
preterm deliveries for fetal growth secondary to size<dates.
Remote read.

[Series 1: us mfm ob follow-up · 59 acquisitions, 13 frames shown]
[im 3/59]
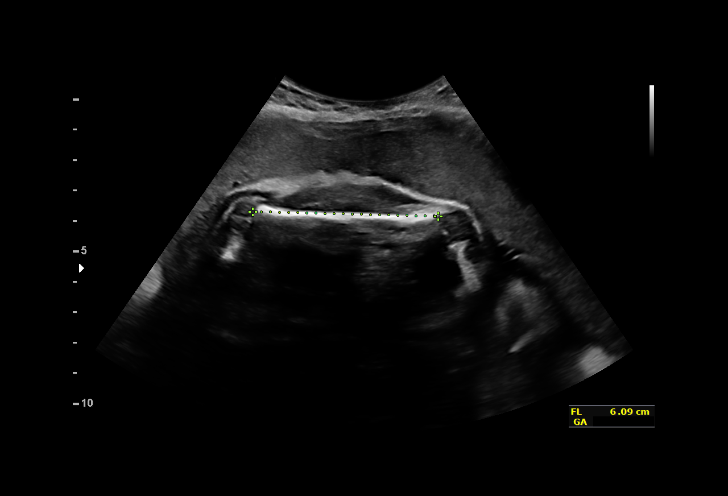
[im 7/59]
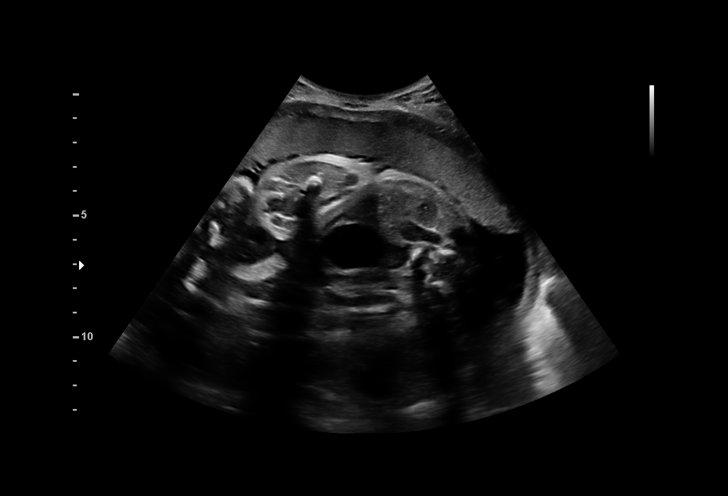
[im 11/59]
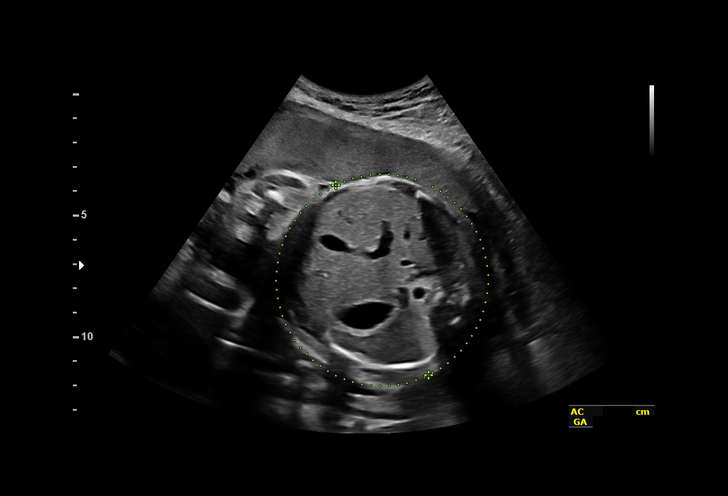
[im 16/59]
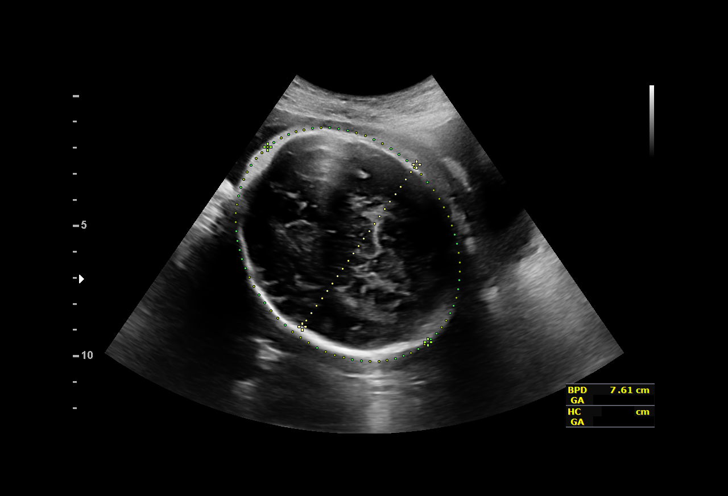
[im 20/59]
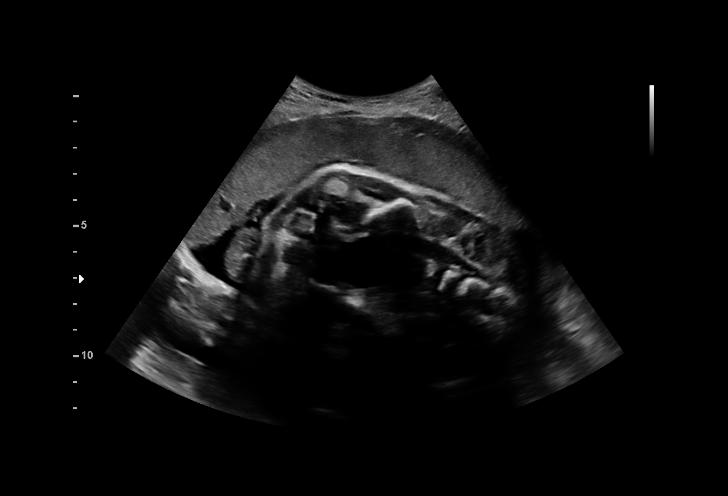
[im 24/59]
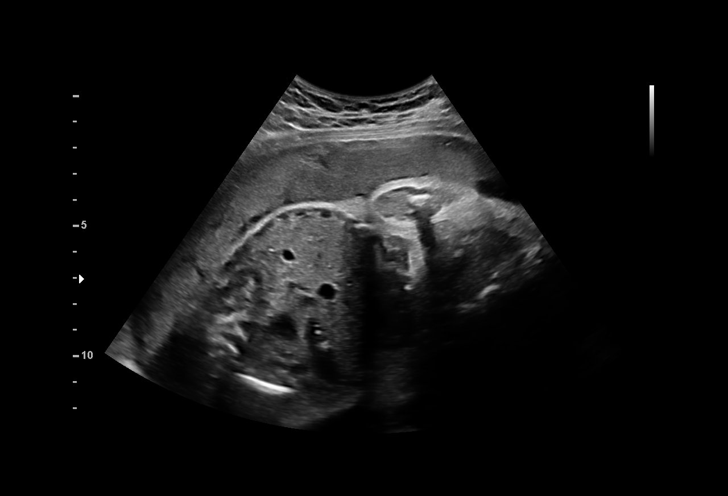
[im 31/59]
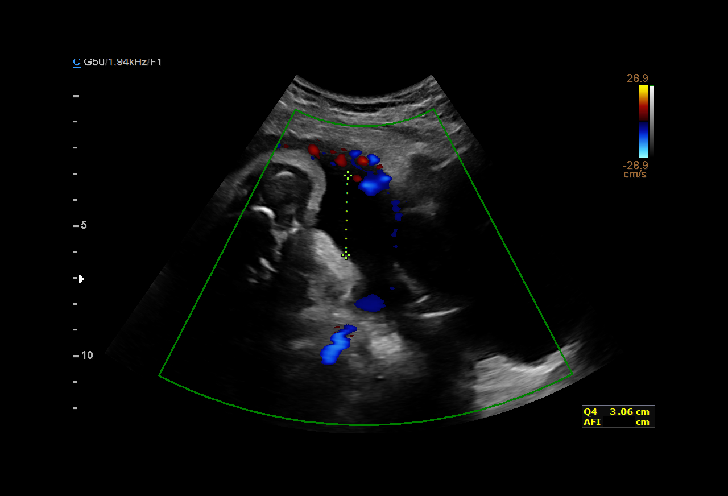
[im 35/59]
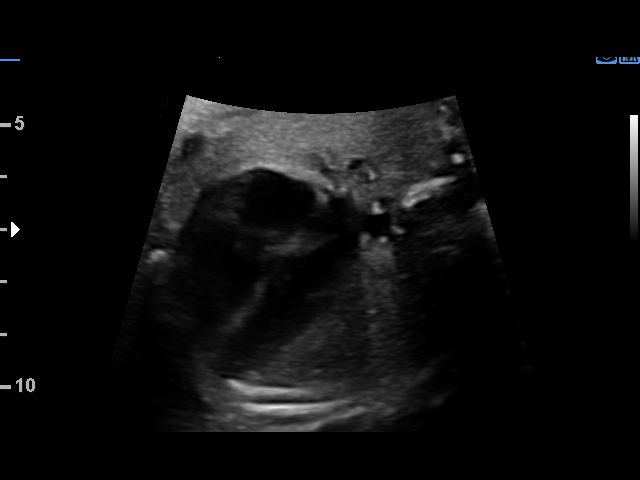
[im 39/59]
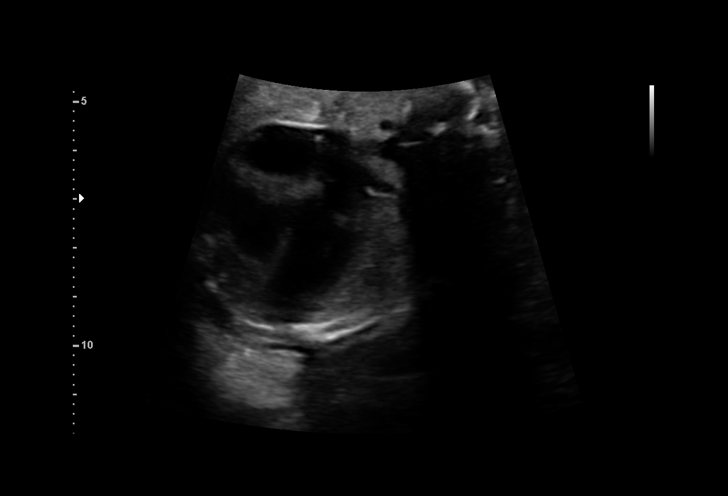
[im 43/59]
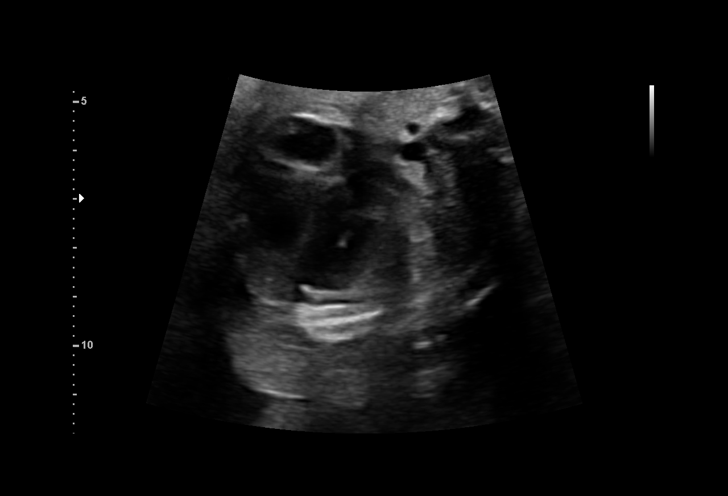
[im 48/59]
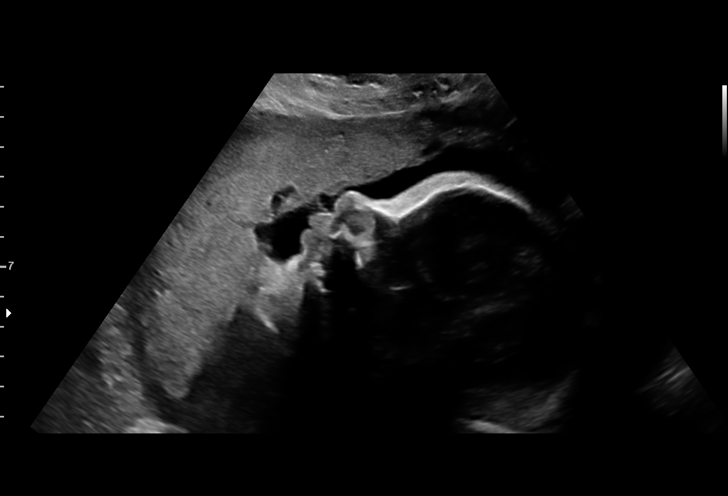
[im 52/59]
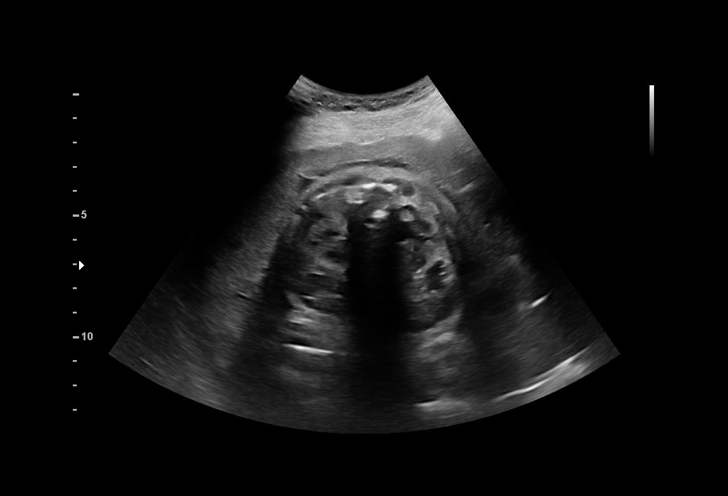
[im 56/59]
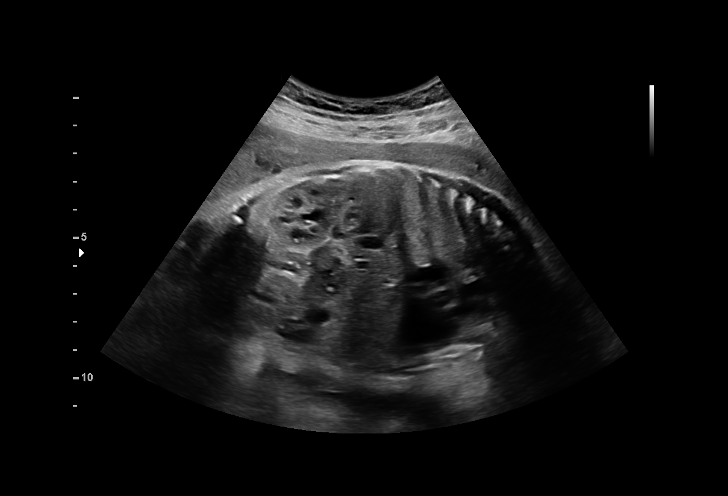

[13 of 28 positions shown; findings below may reference images not displayed]

FINDINGS: 1. Single intrauterine pregnancy.
2. Estimated fetal weight is in the 49th%.
3. Anterior placenta without evidence of previa.
4. Normal amniotic fluid index.
5. Again seen is an echogenic focus in the left ventricle.
6. The remainder of the limited anatomy survey is normal.
Recommendations

1. Appropriate fetal growth.
2. Normal AFI.
3. Echogenic focus in the left ventricle:
- seen previously
4. Normal MSAFP
5. Previous preterm deliveries:
- on [place]
- recommend preterm labor precautions
6. Per chart has red cell antibody:
- could not be identified
- recommend send repeat sample and consider sending to
[facility] for typing if needed
7. Recommend follow up ultrasounds as clinically indicated.

## 2022-04-10 ENCOUNTER — Emergency Department (HOSPITAL_BASED_OUTPATIENT_CLINIC_OR_DEPARTMENT_OTHER): Payer: Medicaid Other | Admitting: Radiology

## 2022-04-10 ENCOUNTER — Emergency Department (HOSPITAL_BASED_OUTPATIENT_CLINIC_OR_DEPARTMENT_OTHER)
Admission: EM | Admit: 2022-04-10 | Discharge: 2022-04-10 | Disposition: A | Discharge status: Home / Self Care | Payer: Medicaid Other | Attending: Emergency Medicine | Admitting: Emergency Medicine

## 2022-04-10 ENCOUNTER — Encounter (HOSPITAL_BASED_OUTPATIENT_CLINIC_OR_DEPARTMENT_OTHER): Payer: Self-pay | Admitting: Obstetrics and Gynecology

## 2022-04-10 ENCOUNTER — Other Ambulatory Visit: Payer: Self-pay

## 2022-04-10 DIAGNOSIS — R000 Tachycardia, unspecified: Secondary | ICD-10-CM | POA: Insufficient documentation

## 2022-04-10 DIAGNOSIS — R091 Pleurisy: Secondary | ICD-10-CM | POA: Insufficient documentation

## 2022-04-10 DIAGNOSIS — J45909 Unspecified asthma, uncomplicated: Secondary | ICD-10-CM | POA: Insufficient documentation

## 2022-04-10 MED ORDER — IPRATROPIUM-ALBUTEROL 0.5-2.5 (3) MG/3ML IN SOLN
3.0000 mL | Freq: Once | RESPIRATORY_TRACT | Status: AC
  Administered 2022-04-10: 3 mL via RESPIRATORY_TRACT

## 2022-04-10 MED ORDER — IPRATROPIUM-ALBUTEROL 0.5-2.5 (3) MG/3ML IN SOLN
RESPIRATORY_TRACT | Status: AC
  Filled 2022-04-10: qty 3

## 2022-04-10 MED ORDER — KETOROLAC TROMETHAMINE 60 MG/2ML IM SOLN
60.0000 mg | Freq: Once | INTRAMUSCULAR | Status: AC
  Administered 2022-04-10: 60 mg via INTRAMUSCULAR
  Filled 2022-04-10: qty 2

## 2022-04-10 MED ORDER — ALBUTEROL SULFATE HFA 108 (90 BASE) MCG/ACT IN AERS
2.0000 | INHALATION_SPRAY | RESPIRATORY_TRACT | Status: DC | PRN
  Administered 2022-04-10: 2 via RESPIRATORY_TRACT
  Filled 2022-04-10: qty 6.7

## 2022-04-10 NOTE — ED Notes (Signed)
Patient transported to Radiology 

## 2022-04-10 NOTE — Discharge Instructions (Signed)
Use the inhaler as needed.  Take tylenol and ibuprofen for pain.  If you feel like it is not improving you can always start the steroids in the next few days.  Suspect this is most likely pleurisy and the lung lining is just inflamed.  It could be related to the weather.  If you start running a fever, feel like you cannot catch her breath and things are getting worse return to the ER.
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
## 2022-04-10 NOTE — ED Provider Notes (Signed)
[facility] EMERGENCY DEPT Provider Note   CSN: [id number] Arrival date & time: 04/10/22  1234     History  Chief Complaint  Patient presents with   Asthma    Lacey Hill is a 31 y.o. female.  Patient is a 31 year old female with a history of asthma presenting today with complaint of chest pain.  Patient reports that she felt fine when she went to bed last night and woke up this morning with pain across her chest which is much worse when she takes a deep breath.  She feels that she is not able to take a deep breath because it makes the pain worse.  She has not had recent cough, congestion, fever.  She denies any recent travel, unilateral leg pain or swelling.  She tried using her inhaler at home but it was expired and she did not feel like it improved her symptoms.  The history is provided by the patient.  Asthma       Home Medications Prior to Admission medications   Medication Sig Start Date End Date Taking? Authorizing Provider  acetaminophen (TYLENOL) 500 MG tablet Take 500 mg by mouth every 6 (six) hours as needed for moderate pain or headache.    [provider]  albuterol (PROVENTIL HFA;VENTOLIN HFA) 108 (90 Base) MCG/ACT inhaler Inhale 1 puff into the lungs every 6 (six) hours as needed for wheezing or shortness of breath. 01/17/17   Constant, Peggy, MD  clindamycin (CLEOCIN) 300 MG capsule Take 1 capsule (300 mg total) by mouth 4 (four) times daily. X 7 days 12/17/17   Loren Racer, MD  famotidine (PEPCID) 20 MG tablet Take 1 tablet (20 mg total) by mouth 2 (two) times daily. 01/17/17   Constant, Peggy, MD  fluconazole (DIFLUCAN) 200 MG tablet Take 1 tablet (200 mg total) by mouth daily. On day one take 2 tablets then one tablet daily until completed. 05/30/17   Constant, Peggy, MD  ibuprofen (ADVIL,MOTRIN) 600 MG tablet Take 1 tablet (600 mg total) by mouth every 6 (six) hours. 04/03/17   Lennox Solders, MD  Prenatal Vit-Fe Fumarate-FA  (PRENATAL MULTIVITAMIN) TABS tablet Take 1 tablet by mouth daily at 12 noon. 05/30/17   Lennox Solders, MD  valACYclovir (VALTREX) 500 MG tablet Take two tablets by mouth twice daily for ten days; then one tablet twice daily for remainder of pregnancy 03/21/17   Constant, Peggy, MD      Allergies    Penicillins    Review of Systems   Review of Systems  Physical Exam Updated Vital Signs BP 123/76   Pulse (!) 103   Temp 98.2 F (36.8 C) (Oral)   Resp 17   Ht 5' (1.524 m)   Wt 59.1 kg   LMP 03/14/2022 (Approximate) Comment: tubal  SpO2 100%   Breastfeeding No   BMI 25.45 kg/m  Physical Exam Vitals and nursing note reviewed.  Constitutional:      General: She is not in acute distress.    Appearance: She is well-developed.  HENT:     Head: Normocephalic and atraumatic.  Eyes:     Pupils: Pupils are equal, round, and reactive to light.  Cardiovascular:     Rate and Rhythm: Regular rhythm. Tachycardia present.     Heart sounds: Normal heart sounds. No murmur heard.    No friction rub.  Pulmonary:     Effort: Pulmonary effort is normal.     Breath sounds: No wheezing or rales.  Comments: Decreased breath sounds throughout due to shallow breathing Chest:     Chest wall: No tenderness.  Abdominal:     General: Bowel sounds are normal. There is no distension.     Palpations: Abdomen is soft.     Tenderness: There is no abdominal tenderness. There is no guarding or rebound.  Musculoskeletal:        General: No tenderness. Normal range of motion.     Right lower leg: No edema.     Left lower leg: No edema.     Comments: No edema  Skin:    General: Skin is warm and dry.     Findings: No rash.  Neurological:     Mental Status: She is alert and oriented to person, place, and time. Mental status is at baseline.     Cranial Nerves: No cranial nerve deficit.  Psychiatric:        Mood and Affect: Mood normal.        Behavior: Behavior normal.     ED Results /  Procedures / Treatments   Labs (all labs ordered are listed, but only abnormal results are displayed) Labs Reviewed - No data to display  EKG EKG Interpretation  Date/Time:  Tuesday April 10 2022 13:21:54 EDT Ventricular Rate:  115 PR Interval:  135 QRS Duration: 90 QT Interval:  329 QTC Calculation: 455 R Axis:   54 Text Interpretation: Sinus tachycardia No previous tracing Confirmed by Gwyneth Sprout ([id number]) on 04/10/2022 1:45:44 PM  Radiology DG Chest 2 View  Result Date: 04/10/2022 CLINICAL DATA:  Chest pain and shortness of breath.  Asthma. EXAM: CHEST - 2 VIEW COMPARISON:  12/17/2017 FINDINGS: The heart size and mediastinal contours are within normal limits. Both lungs are clear. The visualized skeletal structures are unremarkable. IMPRESSION: No active cardiopulmonary disease. Electronically Signed   By: Danae Orleans M.D.   On: 04/10/2022 13:49    Procedures Procedures    Medications Ordered in ED Medications  albuterol (VENTOLIN HFA) 108 (90 Base) MCG/ACT inhaler 2 puff (has no administration in time range)  ipratropium-albuterol (DUONEB) 0.5-2.5 (3) MG/3ML nebulizer solution (  Canceled Entry 04/10/22 1259)  ipratropium-albuterol (DUONEB) 0.5-2.5 (3) MG/3ML nebulizer solution 3 mL (3 mLs Nebulization Given 04/10/22 1255)  ketorolac (TORADOL) injection 60 mg (60 mg Intramuscular Given 04/10/22 1323)    ED Course/ Medical Decision Making/ A&P                           Medical Decision Making Amount and/or Complexity of Data Reviewed Radiology: ordered and independent interpretation performed. Decision-making details documented in ED Course. ECG/medicine tests: ordered and independent interpretation performed. Decision-making details documented in ED Course.  Risk Prescription drug management.   Patient presenting today with complaint of chest pain which is pleuritic in nature.  Patient's breath sounds are decreased bilaterally due to effort due to pain when she  takes a deep breath but lower suspicion for pneumothorax.  She does not have any infectious symptoms to suggest pneumonia.  She is not wheezing at this time but was given an inhaler prior to my evaluation and reports she feels like it helps some.  Possibility that this could be is pleurisy but she reports she has had pleurisy in the past and it was more painful than this.  She does not have significant risk factors for PE as she is not on any OCPs has not had any recent travel and denies prior history  of PE or DVT.  We will start with a EKG and chest x-ray.  Patient given Toradol.  3:00 PM I have independently visualized and interpreted pt's images today. CXR without acute findings.  I independently interpreted pt's EKG and sinus tachycardia but no other acute findings.  On repeat evaluation patient is feeling better.  Her tachycardia has resolved and was most likely from the albuterol she received earlier.  At this time low suspicion for PE.  Patient given a new inhaler.  She was given return precautions.         Final Clinical Impression(s) / ED Diagnoses Final diagnoses:  Pleurisy    Rx / DC Orders ED Discharge Orders     None         Gwyneth Sprout, MD 04/10/22 [phone]

## 2022-04-10 NOTE — ED Notes (Signed)
Patient arrived to the ED with CC of shortness of breath. Patient has a history of asthma and took her inhaler today without relief. Patient endorses that her albuterol inhaler is expired (expired in 2018). Patient denies being a current smoker. Denies any known sick contacts. RT assessment completed in triage. BBS diminished without other adventitious breath sounds at time of assessment. Duoneb x1 ordered in addition to albuterol MDI Q4 PRN.

## 2022-04-10 NOTE — ED Notes (Signed)
RN provided AVS using Teachback Method. Patient verbalizes understanding of Discharge Instructions. Opportunity for Questioning and Answers were provided by RN. Patient Discharged from ED ambulatory to Home via self.

## 2022-04-10 NOTE — ED Notes (Signed)
MD at the Bedside. 

## 2022-07-06 DIAGNOSIS — Z419 Encounter for procedure for purposes other than remedying health state, unspecified: Secondary | ICD-10-CM | POA: Diagnosis not present

## 2022-08-06 DIAGNOSIS — Z419 Encounter for procedure for purposes other than remedying health state, unspecified: Secondary | ICD-10-CM | POA: Diagnosis not present

## 2022-09-06 DIAGNOSIS — Z419 Encounter for procedure for purposes other than remedying health state, unspecified: Secondary | ICD-10-CM | POA: Diagnosis not present

## 2022-10-02 DIAGNOSIS — H5213 Myopia, bilateral: Secondary | ICD-10-CM | POA: Diagnosis not present

## 2022-10-05 DIAGNOSIS — Z419 Encounter for procedure for purposes other than remedying health state, unspecified: Secondary | ICD-10-CM | POA: Diagnosis not present

## 2022-10-17 ENCOUNTER — Telehealth: Payer: Self-pay

## 2022-10-17 NOTE — Telephone Encounter (Signed)
Mychart msg sent

## 2022-11-05 DIAGNOSIS — Z419 Encounter for procedure for purposes other than remedying health state, unspecified: Secondary | ICD-10-CM | POA: Diagnosis not present

## 2022-11-05 DIAGNOSIS — H52223 Regular astigmatism, bilateral: Secondary | ICD-10-CM | POA: Diagnosis not present

## 2022-11-05 DIAGNOSIS — H5213 Myopia, bilateral: Secondary | ICD-10-CM | POA: Diagnosis not present

## 2022-12-05 DIAGNOSIS — Z419 Encounter for procedure for purposes other than remedying health state, unspecified: Secondary | ICD-10-CM | POA: Diagnosis not present

## 2023-01-05 DIAGNOSIS — Z419 Encounter for procedure for purposes other than remedying health state, unspecified: Secondary | ICD-10-CM | POA: Diagnosis not present

## 2023-02-05 ENCOUNTER — Ambulatory Visit: Payer: Self-pay | Admitting: Family Medicine

## 2023-02-05 DIAGNOSIS — Z7689 Persons encountering health services in other specified circumstances: Secondary | ICD-10-CM

## 2024-04-17 DIAGNOSIS — Z419 Encounter for procedure for purposes other than remedying health state, unspecified: Secondary | ICD-10-CM | POA: Diagnosis not present

## 2024-06-17 DIAGNOSIS — Z419 Encounter for procedure for purposes other than remedying health state, unspecified: Secondary | ICD-10-CM | POA: Diagnosis not present

## 2024-08-11 ENCOUNTER — Encounter (HOSPITAL_BASED_OUTPATIENT_CLINIC_OR_DEPARTMENT_OTHER): Payer: Self-pay

## 2024-08-11 ENCOUNTER — Telehealth (HOSPITAL_BASED_OUTPATIENT_CLINIC_OR_DEPARTMENT_OTHER): Payer: Self-pay | Admitting: Emergency Medicine

## 2024-08-11 ENCOUNTER — Emergency Department (HOSPITAL_BASED_OUTPATIENT_CLINIC_OR_DEPARTMENT_OTHER)
Admission: EM | Admit: 2024-08-11 | Discharge: 2024-08-11 | Disposition: A | Discharge status: Home / Self Care | Attending: Emergency Medicine | Admitting: Emergency Medicine

## 2024-08-11 ENCOUNTER — Other Ambulatory Visit: Payer: Self-pay

## 2024-08-11 DIAGNOSIS — H9202 Otalgia, left ear: Secondary | ICD-10-CM | POA: Diagnosis present

## 2024-08-11 DIAGNOSIS — H6690 Otitis media, unspecified, unspecified ear: Secondary | ICD-10-CM

## 2024-08-11 MED ORDER — CEFDINIR 300 MG PO CAPS: 600.0000 mg | ORAL_CAPSULE | Freq: Every day | ORAL | 0 refills | Status: DC

## 2024-08-11 MED ORDER — CEFDINIR 300 MG PO CAPS: 600.0000 mg | ORAL_CAPSULE | Freq: Every day | ORAL | 0 refills | Status: AC

## 2024-08-11 NOTE — ED Triage Notes (Signed)
 Pt c/o L ear pain starting last night and nasal congestion since before Christmas.  Pain score 8/10.  Pt reports taking ibuprofen  prior to arrival.

## 2024-08-11 NOTE — Discharge Instructions (Signed)
 Take antibiotics as prescribed and complete the full course. Recheck with your primary care provider if not improving.

## 2024-08-11 NOTE — ED Provider Notes (Signed)
 " [place] EMERGENCY DEPARTMENT AT [facility] Provider Note   CSN: [id number] Arrival date & time: 08/11/24  1103     Patient presents with: Otalgia   Lacey Hill is a 34 y.o. female.   34 yo female with left ear pain, onset last night. URI symptoms onset 2 weeks ago. No fevers, no drainage. No other complaints.       Prior to Admission medications  Medication Sig Start Date End Date Taking? Authorizing Provider  acetaminophen  (TYLENOL ) 500 MG tablet Take 500 mg by mouth every 6 (six) hours as needed for moderate pain or headache.    [provider]  albuterol  (PROVENTIL  HFA;VENTOLIN  HFA) 108 (90 Base) MCG/ACT inhaler Inhale 1 puff into the lungs every 6 (six) hours as needed for wheezing or shortness of breath. 01/17/17   Constant, Peggy, MD  clindamycin  (CLEOCIN ) 300 MG capsule Take 1 capsule (300 mg total) by mouth 4 (four) times daily. X 7 days 12/17/17   Carlyle Lenis, MD  famotidine  (PEPCID ) 20 MG tablet Take 1 tablet (20 mg total) by mouth 2 (two) times daily. 01/17/17   Constant, Peggy, MD  fluconazole  (DIFLUCAN ) 200 MG tablet Take 1 tablet (200 mg total) by mouth daily. On day one take 2 tablets then one tablet daily until completed. 05/30/17   Constant, Peggy, MD  hydrOXYzine (ATARAX) 50 MG tablet Take 50 mg by mouth every 6 (six) hours as needed. 02/02/23   [provider]  ibuprofen  (ADVIL ,MOTRIN ) 600 MG tablet Take 1 tablet (600 mg total) by mouth every 6 (six) hours. 04/03/17   Francesco Alan BROCKS, MD  Prenatal Vit-Fe Fumarate-FA (PRENATAL MULTIVITAMIN) TABS tablet Take 1 tablet by mouth daily at 12 noon. 05/30/17   Francesco Alan BROCKS, MD  valACYclovir  (VALTREX ) 500 MG tablet Take two tablets by mouth twice daily for ten days; then one tablet twice daily for remainder of pregnancy 03/21/17   Constant, Peggy, MD    Allergies: Mangifera indica fruit ext (mango) [mangifera indica] and Penicillins    Review of Systems Negative  except as per HPI Updated Vital Signs BP (!) 117/92 (BP Location: Right Arm)   Pulse 76   Temp 98.6 F (37 C) (Oral)   Resp 16   Ht 5' (1.524 m)   Wt 59 kg   SpO2 98%   BMI 25.39 kg/m   Physical Exam Vitals and nursing note reviewed.  Constitutional:      General: She is not in acute distress.    Appearance: She is well-developed. She is not diaphoretic.  HENT:     Head: Normocephalic and atraumatic.     Right Ear: Tympanic membrane and ear canal normal. There is no impacted cerumen.     Left Ear: Ear canal and external ear normal. A middle ear effusion is present. There is no impacted cerumen. Tympanic membrane is injected, erythematous and bulging.  Pulmonary:     Effort: Pulmonary effort is normal.  Musculoskeletal:     Cervical back: Neck supple.  Lymphadenopathy:     Cervical: No cervical adenopathy.  Neurological:     Mental Status: She is alert and oriented to person, place, and time.  Psychiatric:        Behavior: Behavior normal.     (all labs ordered are listed, but only abnormal results are displayed) Labs Reviewed - No data to display  EKG: None  Radiology: No results found.   Procedures   Medications Ordered in the ED - No data to  display                                  Medical Decision Making  34 yo female with left ear pain following URI. OM on exam. PCN allergy from childhood, unknown rxn. Will cover with Cefdinir .      Final diagnoses:  None    ED Discharge Orders     None          Beverley Leita LABOR, PA-C 08/11/24 1252  "

## 2024-08-19 ENCOUNTER — Emergency Department (HOSPITAL_BASED_OUTPATIENT_CLINIC_OR_DEPARTMENT_OTHER)
Admission: EM | Admit: 2024-08-19 | Discharge: 2024-08-19 | Disposition: A | Discharge status: Home / Self Care | Attending: Emergency Medicine | Admitting: Emergency Medicine

## 2024-08-19 ENCOUNTER — Emergency Department (HOSPITAL_BASED_OUTPATIENT_CLINIC_OR_DEPARTMENT_OTHER)

## 2024-08-19 ENCOUNTER — Other Ambulatory Visit: Payer: Self-pay

## 2024-08-19 DIAGNOSIS — H66002 Acute suppurative otitis media without spontaneous rupture of ear drum, left ear: Secondary | ICD-10-CM | POA: Insufficient documentation

## 2024-08-19 DIAGNOSIS — H9202 Otalgia, left ear: Secondary | ICD-10-CM | POA: Diagnosis present

## 2024-08-19 DIAGNOSIS — R519 Headache, unspecified: Secondary | ICD-10-CM | POA: Diagnosis not present

## 2024-08-19 MED ORDER — CIPROFLOXACIN HCL 0.3 % OP SOLN: 1.0000 [drp] | Freq: Once | OPHTHALMIC | Status: DC

## 2024-08-19 MED ORDER — DEXAMETHASONE SOD PHOSPHATE PF 10 MG/ML IJ SOLN
10.0000 mg | Freq: Once | INTRAMUSCULAR | Status: AC
  Administered 2024-08-19: 10 mg via INTRAVENOUS
  Filled 2024-08-19: qty 1

## 2024-08-19 MED ORDER — LEVOFLOXACIN 750 MG PO TABS: 750.0000 mg | ORAL_TABLET | Freq: Every day | ORAL | 0 refills | Status: AC

## 2024-08-19 MED ORDER — LEVOFLOXACIN 750 MG PO TABS
750.0000 mg | ORAL_TABLET | Freq: Once | ORAL | Status: AC
  Administered 2024-08-19: 750 mg via ORAL
  Filled 2024-08-19: qty 1

## 2024-08-19 MED ORDER — LEVOFLOXACIN 750 MG PO TABS: 750.0000 mg | ORAL_TABLET | Freq: Every day | ORAL | 0 refills | Status: DC

## 2024-08-19 MED ORDER — OFLOXACIN 0.3 % OP SOLN
5.0000 [drp] | Freq: Every day | OPHTHALMIC | Status: DC
  Administered 2024-08-19: 5 [drp] via OTIC
  Filled 2024-08-19: qty 5

## 2024-08-19 MED ORDER — NAPROXEN 375 MG PO TABS: 375.0000 mg | ORAL_TABLET | Freq: Two times a day (BID) | ORAL | 0 refills | Status: AC

## 2024-08-19 MED ORDER — METHYLPREDNISOLONE 4 MG PO TBPK: ORAL_TABLET | ORAL | 0 refills | Status: AC

## 2024-08-19 MED ORDER — CIPROFLOXACIN HCL 0.3 % OP SOLN
4.0000 [drp] | Freq: Once | OPHTHALMIC | Status: AC
  Administered 2024-08-19: 4 [drp] via OTIC
  Filled 2024-08-19: qty 5

## 2024-08-19 MED ORDER — KETOROLAC TROMETHAMINE 30 MG/ML IJ SOLN
30.0000 mg | Freq: Once | INTRAMUSCULAR | Status: AC
  Administered 2024-08-19: 30 mg via INTRAVENOUS
  Filled 2024-08-19: qty 1

## 2024-08-19 MED ORDER — SODIUM CHLORIDE 0.9 % IV BOLUS
1000.0000 mL | Freq: Once | INTRAVENOUS | Status: AC
  Administered 2024-08-19: 1000 mL via INTRAVENOUS

## 2024-08-19 MED ORDER — METOCLOPRAMIDE HCL 5 MG/ML IJ SOLN
10.0000 mg | Freq: Once | INTRAMUSCULAR | Status: AC
  Administered 2024-08-19: 10 mg via INTRAVENOUS
  Filled 2024-08-19: qty 2

## 2024-08-19 NOTE — ED Triage Notes (Signed)
 Pt c/o continued L ear pain despite abx and waking up with a migraine this morning. No meds PTA. Pt states that she also has had malaise and chills.

## 2024-08-19 NOTE — Discharge Instructions (Addendum)
 Use the cipro  drops,4 drops in the left ear 2 x a day for 7 days.  Contact a health care provider if: You have bleeding from your nose. There is a lump on your neck. You are not feeling better in 5 days. You feel worse instead of better. Get help right away if: You have severe pain that is not controlled with medicine. You have swelling, redness, or pain around your ear. You have stiffness in your neck. A part of your face is not moving (paralyzed). The bone behind your ear (mastoid bone) is tender when you touch it. You develop a severe headache.

## 2024-08-19 NOTE — ED Provider Notes (Signed)
 " [place] [facility] AT [facility] Provider Note   CSN: [id number] Arrival date & time: 08/19/24  1216     Patient presents with: Otalgia and Migraine   Lacey Hill is a 34 y.o. female presents emergency department chief complaint of ear pain and throbbing headache.  Patient reports that she first got sick on Christmas Eve with severe ear pain.  She was diagnosed with acute otitis media and treated with a round of cephalosporins.  Patient states that she took her course but never fully improved.  Over the past several days she has had progressively worsening pain loss of hearing she now complains of throbbing headache on the left side of her face.  She denies fevers she does not have a history of migraine headaches.    Otalgia Migraine       Prior to Admission medications  Medication Sig Start Date End Date Taking? Authorizing Provider  acetaminophen  (TYLENOL ) 500 MG tablet Take 500 mg by mouth every 6 (six) hours as needed for moderate pain or headache.    [provider]  albuterol  (PROVENTIL  HFA;VENTOLIN  HFA) 108 (90 Base) MCG/ACT inhaler Inhale 1 puff into the lungs every 6 (six) hours as needed for wheezing or shortness of breath. 01/17/17   Constant, Peggy, MD  clindamycin  (CLEOCIN ) 300 MG capsule Take 1 capsule (300 mg total) by mouth 4 (four) times daily. X 7 days 12/17/17   Carlyle Lenis, MD  famotidine  (PEPCID ) 20 MG tablet Take 1 tablet (20 mg total) by mouth 2 (two) times daily. 01/17/17   Constant, Peggy, MD  fluconazole  (DIFLUCAN ) 200 MG tablet Take 1 tablet (200 mg total) by mouth daily. On day one take 2 tablets then one tablet daily until completed. 05/30/17   Constant, Peggy, MD  hydrOXYzine (ATARAX) 50 MG tablet Take 50 mg by mouth every 6 (six) hours as needed. 02/02/23   [provider]  ibuprofen  (ADVIL ,MOTRIN ) 600 MG tablet Take 1 tablet (600 mg total) by mouth every 6 (six) hours. 04/03/17   Francesco Alan BROCKS, MD  Prenatal Vit-Fe Fumarate-FA (PRENATAL MULTIVITAMIN) TABS tablet Take 1 tablet by mouth daily at 12 noon. 05/30/17   Francesco Alan BROCKS, MD  valACYclovir  (VALTREX ) 500 MG tablet Take two tablets by mouth twice daily for ten days; then one tablet twice daily for remainder of pregnancy 03/21/17   Constant, Peggy, MD    Allergies: Mangifera indica fruit ext (mango) [mangifera indica] and Penicillins    Review of Systems  HENT:  Positive for ear pain.     Updated Vital Signs BP 118/78   Pulse 98   Temp 98.6 F (37 C) (Temporal)   Resp 14   Ht 5' (1.524 m)   Wt 63.5 kg   SpO2 96%   BMI 27.34 kg/m   Physical Exam Vitals and nursing note reviewed.  Constitutional:      General: She is not in acute distress.    Appearance: She is well-developed. She is not diaphoretic.  HENT:     Head: Normocephalic and atraumatic.     Right Ear: Tympanic membrane and external ear normal.     Left Ear: External ear normal.     Ears:     Comments: TM perforated at the apex with visible ossicles. No erythema or canal swelling    Nose: Nose normal.     Mouth/Throat:     Mouth: Mucous membranes are moist.  Eyes:     General: No scleral icterus.  Conjunctiva/sclera: Conjunctivae normal.     Pupils: Pupils are equal, round, and reactive to light.     Comments: No horizontal, vertical or rotational nystagmus  Neck:     Comments: Full active and passive ROM without pain No midline or paraspinal tenderness No nuchal rigidity or meningeal signs Cardiovascular:     Rate and Rhythm: Normal rate and regular rhythm.     Heart sounds: Normal heart sounds. No murmur heard.    No friction rub. No gallop.  Pulmonary:     Effort: Pulmonary effort is normal. No respiratory distress.     Breath sounds: Normal breath sounds. No wheezing or rales.  Abdominal:     General: Bowel sounds are normal. There is no distension.     Palpations: Abdomen is soft. There is no mass.     Tenderness: There is no  abdominal tenderness. There is no guarding or rebound.  Musculoskeletal:        General: Normal range of motion.     Cervical back: Normal range of motion and neck supple.  Lymphadenopathy:     Cervical: No cervical adenopathy.  Skin:    General: Skin is warm and dry.     Findings: No rash.  Neurological:     Mental Status: She is alert and oriented to person, place, and time.     Cranial Nerves: No cranial nerve deficit.     Motor: No abnormal muscle tone.     Coordination: Coordination normal.     Deep Tendon Reflexes: Reflexes are normal and symmetric.     Comments: Mental Status:  Alert, oriented, thought content appropriate. Speech fluent without evidence of aphasia. Able to follow 2 step commands without difficulty.  Cranial Nerves:  II:  Peripheral visual fields grossly normal, pupils equal, round, reactive to light III,IV, VI: ptosis not present, extra-ocular motions intact bilaterally  V,VII: smile symmetric, facial light touch sensation equal VIII: hearing grossly normal bilaterally  IX,X: midline uvula rise  XI: bilateral shoulder shrug equal and strong XII: midline tongue extension  Motor:  5/5 in upper and lower extremities bilaterally including strong and equal grip strength and dorsiflexion/plantar flexion Sensory: Pinprick and light touch normal in all extremities.  Deep Tendon Reflexes: 2+ and symmetric  Cerebellar: normal finger-to-nose with bilateral upper extremities Gait: normal gait and balance CV: distal pulses palpable throughout   Psychiatric:        Behavior: Behavior normal.        Thought Content: Thought content normal.        Judgment: Judgment normal.     (all labs ordered are listed, but only abnormal results are displayed) Labs Reviewed - No data to display  EKG: None  Radiology: CT Temporal Bones Wo Contrast Result Date: 08/19/2024 EXAM: CT TEMPORAL BONES WITHOUT CONTRAST 08/19/2024 05:46:33 PM TECHNIQUE: CT of the temporal bones was  performed without the administration of intravenous contrast. Multiplanar reformatted images are provided for review. Automated exposure control, iterative reconstruction, and/or weight based adjustment of the mA/kV was utilized to reduce the radiation dose to as low as reasonably achievable. COMPARISON: Same day CT head. CLINICAL HISTORY: Otitis media, recurrent (Ped 0-17y). FINDINGS: RIGHT TEMPORAL BONE: EXTERNAL AUDITORY CANAL: Clear. No bony erosion. Scutum is intact. MIDDLE EAR CAVITY: Clear. Ossicular chain is intact. MASTOID AIR CELLS: Clear. INNER EAR: The cochlea and vestibule are unremarkable. Normal mineralization of the otic capsule. Normal semicircular canals. The vestibular aqueduct is not dilated. INTERNAL AUDITORY CANAL: Unremarkable. Normal bony canal of the  facial nerve. LEFT TEMPORAL BONE: EXTERNAL AUDITORY CANAL: Clear. No bony erosion. Scutum is intact. MIDDLE EAR CAVITY: Small amount of abnormal soft tissue within the left middle ear cavity. Soft tissue noted within Prussak space abutting the ossicles. Abnormal soft tissue in the middle ear along the anterior and lateral malleolar ligaments likely related to prior infection or inflammation. Mild thickening of the left tympanic membrane. There is no erosive change of the ossicles. MASTOID AIR CELLS: Small left mastoid effusion. The mastoid air cells are relatively clear. Osseous septations of the left mastoid air cells are intact. There is no evidence of coalescent mastoiditis. No soft tissue swelling or abnormal enhancement or evidence of abscess in the soft tissues adjacent to the left mastoid temporal bone. INNER EAR: The cochlea and vestibule are unremarkable. Normal mineralization of the otic capsule. Normal semicircular canals. The vestibular aqueduct is not dilated. INTERNAL AUDITORY CANAL: Unremarkable. Normal bony canal of the facial nerve. VASCULAR: Normal jugular bulbs. Normal carotid canals. BRAIN: Unremarkable. ORBITS: No acute  abnormality. SINUSES: Mucosal thickening throughout the visualized paranasal sinuses particularly in the ethmoid and maxillary sinuses. There are air fluid levels in the bilateral maxillary sinuses. Recommend clinical correlation for acute sinusitis. IMPRESSION: 1. Small left mastoid effusion suggestive of mastoiditis. No evidence of coalescent mastoiditis. 2. Abnormal soft tissue within the left middle ear cavity, likely related to prior infection or inflammation. No evidence of ossicular erosion. 3. Paranasal sinus mucosal thickening, greatest in the ethmoid and maxillary sinuses, with bilateral maxillary air-fluid levels that can be seen with acute sinusitis. Electronically signed by: Donnice Mania MD 08/19/2024 06:30 PM EST RP Workstation: [id number]   CT Head Wo Contrast Result Date: 08/19/2024 CLINICAL DATA:  Headache, left-sided otalgia EXAM: CT HEAD WITHOUT CONTRAST TECHNIQUE: Contiguous axial images were obtained from the base of the skull through the vertex without intravenous contrast. RADIATION DOSE REDUCTION: This exam was performed according to the departmental dose-optimization program which includes automated exposure control, adjustment of the mA and/or kV according to patient size and/or use of iterative reconstruction technique. COMPARISON:  None Available. FINDINGS: Brain: No acute infarct or hemorrhage. Lateral ventricles and midline structures are unremarkable. No acute extra-axial fluid collections. No mass effect. Vascular: No hyperdense vessel or unexpected calcification. Skull: Normal. Negative for fracture or focal lesion. Sinuses/Orbits: Mild mucosal thickening within the ethmoid and maxillary sinuses. 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 A small left mastoid effusion is noted. Other: None. IMPRESSION: 1. No acute intra cranial process. 2. Small left mastoid effusion, with hypoplasia of the left mastoid  air cells. Electronically Signed   By: Ozell Daring M.D.   On: 08/19/2024 17:50     Procedures   Medications Ordered in the ED  ofloxacin  (OCUFLOX ) 0.3 % ophthalmic solution 5 drop (5 drops Left EAR Given 08/19/24 1755)  sodium chloride  0.9 % bolus 1,000 mL (1,000 mLs Intravenous New Bag/Given 08/19/24 1750)  ketorolac  (TORADOL ) 30 MG/ML injection 30 mg (30 mg Intravenous Given 08/19/24 1751)  dexamethasone  (DECADRON ) injection 10 mg (10 mg Intravenous Given 08/19/24 1755)  metoCLOPramide  (REGLAN ) injection 10 mg (10 mg Intravenous Given 08/19/24 1753)    Clinical Course as of 08/19/24 2247  Wed Aug 19, 2024  2246 Case discussed w Dr. Llewellyn -images reviewed- no sign of mastoiditis. Tx with levaquin  and cipro  drops - f/u in clinic  [AH]    Clinical Course User Index [AH] Azim Gillingham, PA-C  Medical Decision Making 34 year old female who presents to the emergency department with a chief complaint of severe headache and left ear pain along with decreased hearing recent acute otitis media with treatment with a cephalosporin.  Differential diagnoses include untreated ear infection, TM perforation, mastoiditis, meningitis. On physical examination patient has no evidence of meningitis including a normal neurologic exam no meningismus no photophobia.  I ordered visualized and interpreted head CT.  Radiology was concern for potential mastoiditis.  As per ED course I discussed the case with Dr. Amaryllis who reviewed the images and feels that this does not represent an acute mastoiditis.  I treated the patient's pain with migraine cocktail including Decadron , Toradol , Reglan  with complete resolution of throbbing headache.  Patient has a noted history of penicillin allergy and although Augmentin would be my first choice that she is unable to take this medication.  Will treat with Levaquin  for broad-spectrum coverage, Dr. Amaryllis advises changing from ofloxacin   to ciprofloxacin  eardrops.  Patient has been diagnosed with acute otitis media with perforation no evidence of mastoiditis or meningitis she is afebrile. Patient will follow-up in clinic.  Will discharge with anti-inflammatories, Medrol  taper, Levaquin , Cipro , strict return precautions.    Amount and/or Complexity of Data Reviewed Radiology: ordered.  Risk Prescription drug management.        Final diagnoses:  None    ED Discharge Orders     None          Arloa Chroman, PA-C 08/20/24 1324    Pamella Ozell LABOR, DO 08/25/24 2345  "
